# Patient Record
Sex: Male | Born: 1960 | Race: White | Hispanic: No | Smoking: Former smoker
Health system: Southern US, Community
[De-identification: ages and names within clinical notes are randomized; demographics above are authoritative.]

## PROBLEM LIST (undated history)

## (undated) DIAGNOSIS — I10 Essential (primary) hypertension: Secondary | ICD-10-CM

## (undated) HISTORY — DX: Essential (primary) hypertension: I10

## (undated) HISTORY — PX: STERNUM SEPARATION REPAIR: SHX393

## (undated) HISTORY — PX: COLONOSCOPY: SHX174

---

## 1998-04-26 ENCOUNTER — Ambulatory Visit (HOSPITAL_COMMUNITY): Admission: RE | Admit: 1998-04-26 | Discharge: 1998-04-26 | Payer: Self-pay | Admitting: Obstetrics and Gynecology

## 2007-10-15 ENCOUNTER — Other Ambulatory Visit: Payer: Self-pay

## 2007-10-15 ENCOUNTER — Emergency Department: Payer: Self-pay | Admitting: Emergency Medicine

## 2008-08-07 ENCOUNTER — Emergency Department (HOSPITAL_COMMUNITY): Admission: EM | Admit: 2008-08-07 | Discharge: 2008-08-08 | Payer: Self-pay | Admitting: Emergency Medicine

## 2009-04-26 ENCOUNTER — Emergency Department: Payer: Self-pay | Admitting: Emergency Medicine

## 2010-09-10 IMAGING — CT CT CHEST W/ CM
1 of 2 series · 14 of 32 positions shown, 18 images · IV contrast (APPLIED)
Comparison: none

REASON FOR EXAM: dyspnea  h/o international travel
COMMENTS:

[Series 5: lung windows · axial · 0.74mm/px · z∈[-126,+172]mm · 14 of 117 slices shown, 18 images]
[im 9/117  mediastinal]
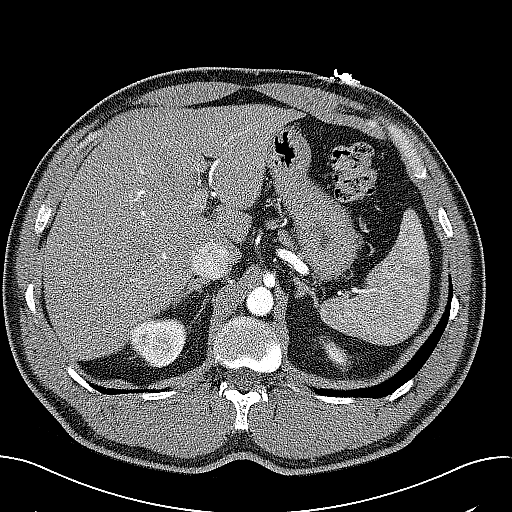
[im 9/117  lung]
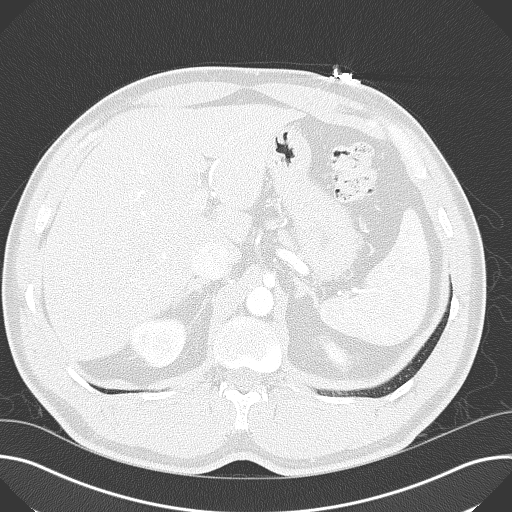
[im 18/117  lung]
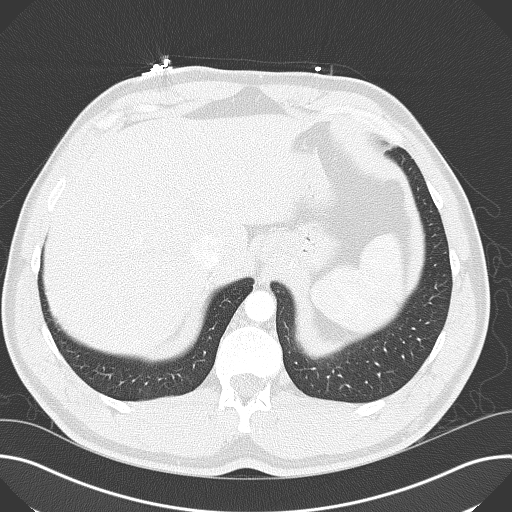
[im 27/117  lung]
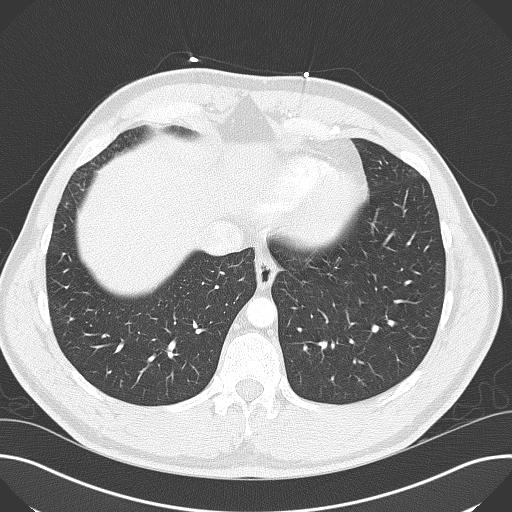
[im 36/117  lung]
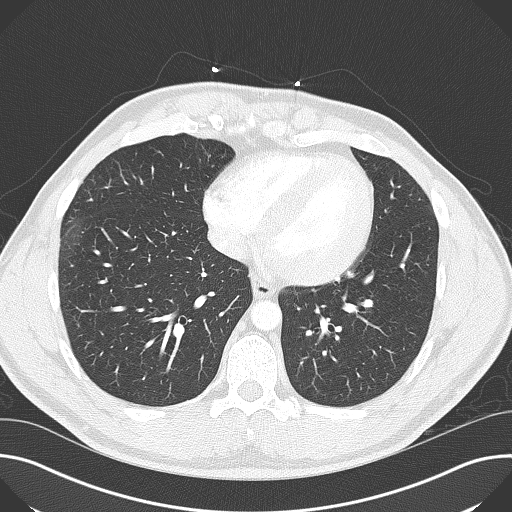
[im 45/117  mediastinal]
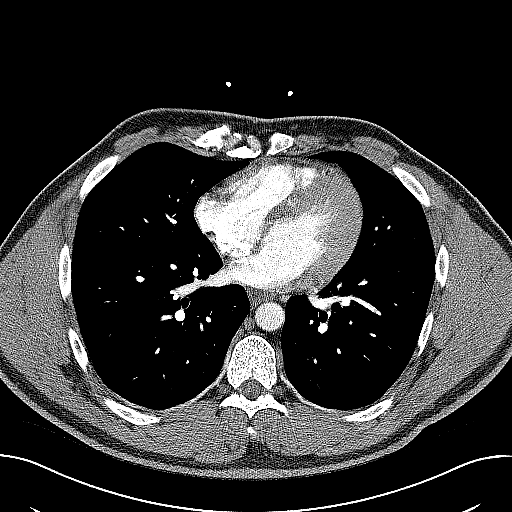
[im 45/117  lung]
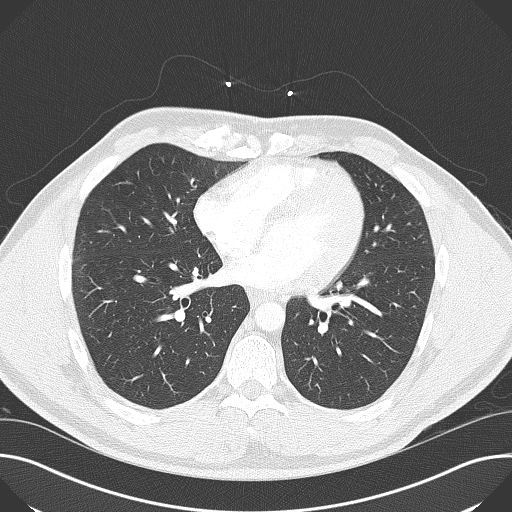
[im 54/117  lung]
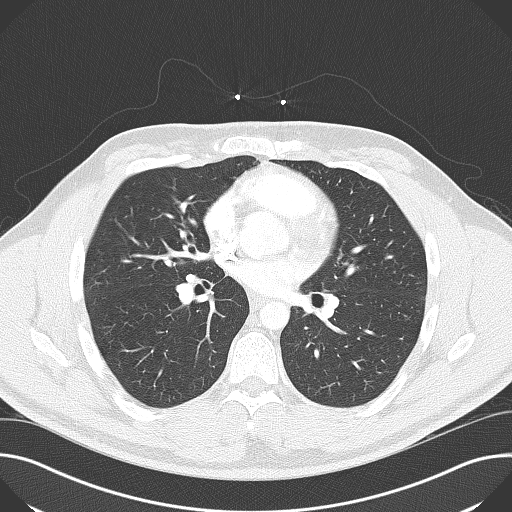
[im 55/117  lung]
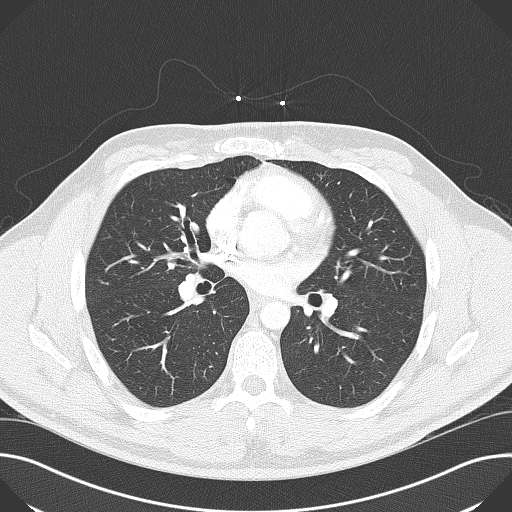
[im 59/117  lung]
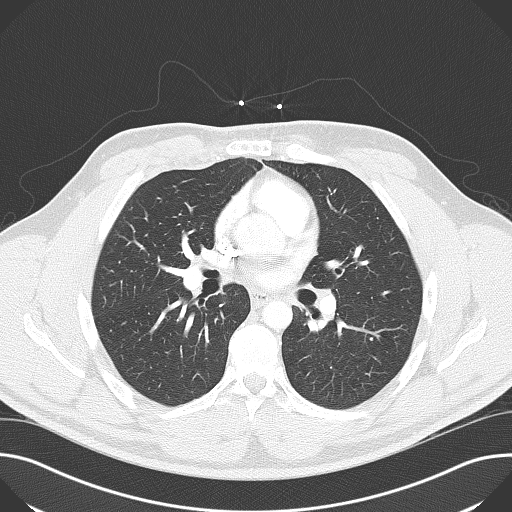
[im 63/117  mediastinal]
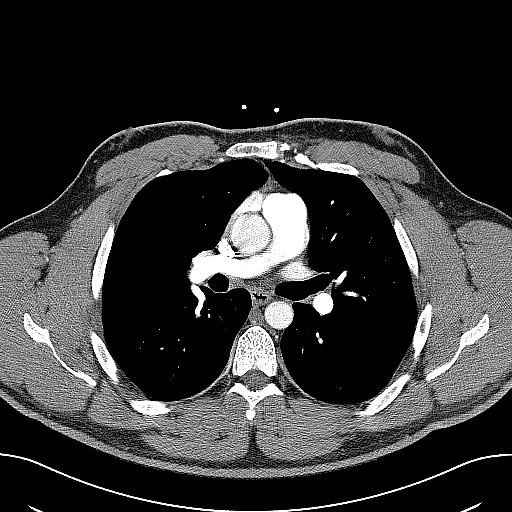
[im 63/117  lung]
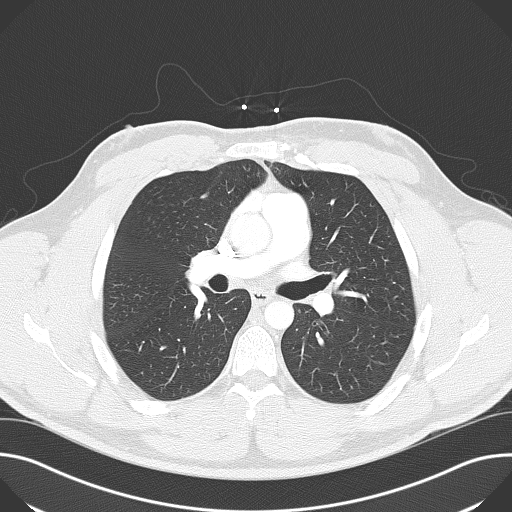
[im 72/117  lung]
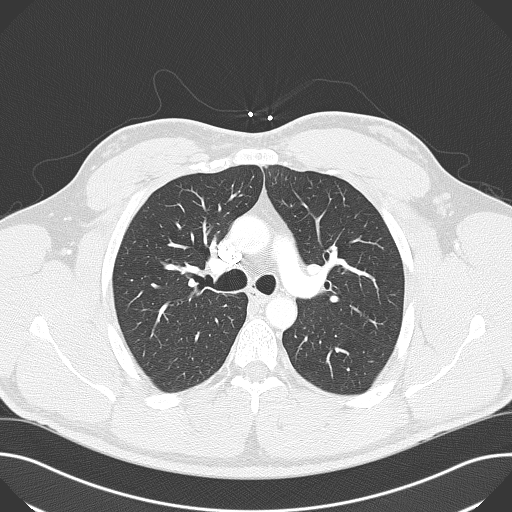
[im 81/117  lung]
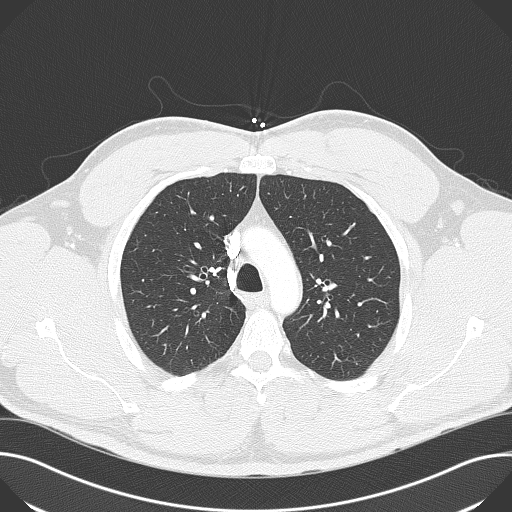
[im 90/117  lung]
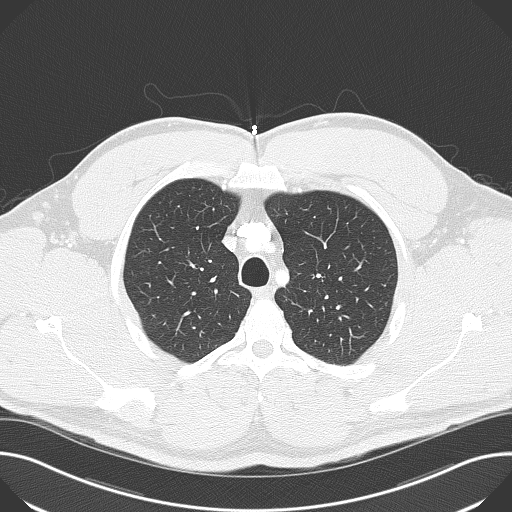
[im 99/117  mediastinal]
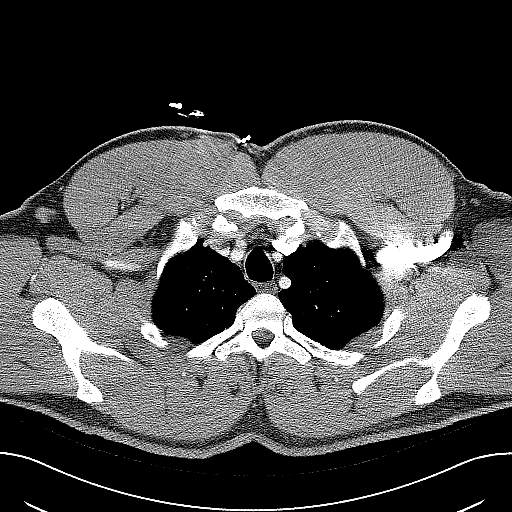
[im 99/117  lung]
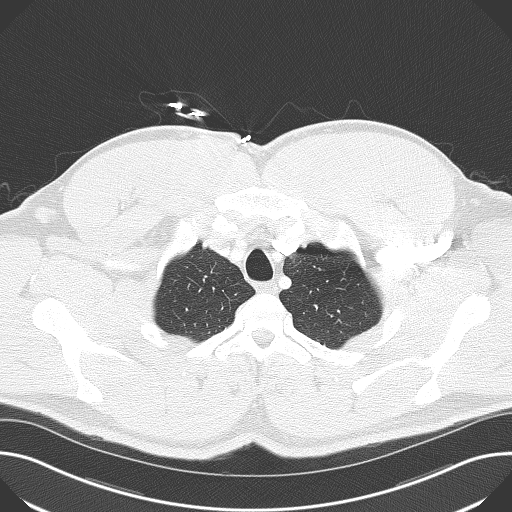
[im 108/117  lung]
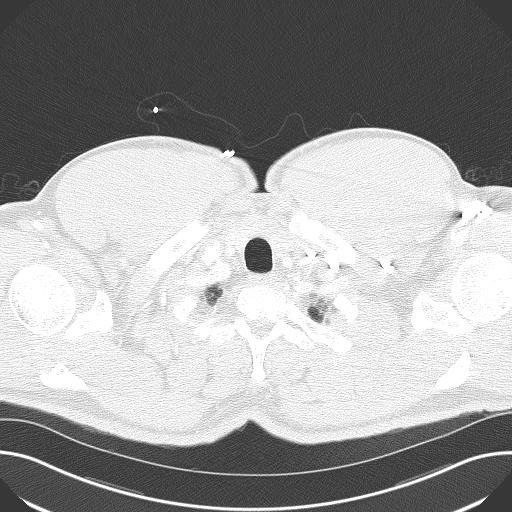

[14 of 32 positions shown; findings below may reference images not displayed]

PROCEDURE:     CT  - CT CHEST (FOR PE) W  - April 26, 2009  [DATE]

RESULT:     Emergent CT of the chest is reconstructed at 3 mm slice
thickness in the axial plane utilizing a pulmonary embolism protocol. There
is no previous exam for comparison. The patient received an injection of 100
mL of Fsovue-CYL iodinated intravenous contrast.

The aorta and pulmonary arteries opacify normally. There is no pleural or
pericardial effusion. The upper abdominal structures appear unremarkable.
There is minimal low-attenuation in the right lobe of the liver on image 112
which is too small for accurate characterization measuring 8 mm. There is no
focal pulmonary nodule. There is no edema, infiltrate, peribronchial
thickening or bronchiectasis.
IMPRESSION: No acute abnormality. No pulmonary embolism. No aortic
dissection or aneurysm. Low-attenuation is present in the right lobe of the
liver which may represent small cyst or hemangioma. This is too small for
accurate characterization.

## 2013-03-06 ENCOUNTER — Ambulatory Visit: Payer: Self-pay | Admitting: Gastroenterology

## 2018-04-18 ENCOUNTER — Ambulatory Visit (HOSPITAL_COMMUNITY): Payer: Self-pay | Admitting: Orthopedic Surgery

## 2018-04-29 ENCOUNTER — Telehealth: Payer: Self-pay

## 2018-04-29 ENCOUNTER — Ambulatory Visit (INDEPENDENT_AMBULATORY_CARE_PROVIDER_SITE_OTHER): Payer: 59 | Admitting: Internal Medicine

## 2018-04-29 ENCOUNTER — Encounter: Payer: Self-pay | Admitting: Internal Medicine

## 2018-04-29 VITALS — BP 185/100 | HR 100 | Temp 98.3°F | Ht 76.0 in | Wt 233.1 lb

## 2018-04-29 DIAGNOSIS — R9431 Abnormal electrocardiogram [ECG] [EKG]: Secondary | ICD-10-CM

## 2018-04-29 DIAGNOSIS — I10 Essential (primary) hypertension: Secondary | ICD-10-CM

## 2018-04-29 DIAGNOSIS — Z23 Encounter for immunization: Secondary | ICD-10-CM

## 2018-04-29 DIAGNOSIS — Z01818 Encounter for other preprocedural examination: Secondary | ICD-10-CM

## 2018-04-29 LAB — CBC WITH DIFFERENTIAL/PLATELET
Basophils Absolute: 0.1 10*3/uL (ref 0.0–0.1)
Basophils Relative: 0.8 % (ref 0.0–3.0)
Eosinophils Absolute: 0.4 10*3/uL (ref 0.0–0.7)
Eosinophils Relative: 4.5 % (ref 0.0–5.0)
HCT: 58.5 % — ABNORMAL HIGH (ref 39.0–52.0)
Hemoglobin: 19.9 g/dL (ref 13.0–17.0)
LYMPHS ABS: 1.3 10*3/uL (ref 0.7–4.0)
Lymphocytes Relative: 16 % (ref 12.0–46.0)
MCHC: 34 g/dL (ref 30.0–36.0)
MCV: 89.5 fl (ref 78.0–100.0)
Monocytes Absolute: 0.6 10*3/uL (ref 0.1–1.0)
Monocytes Relative: 7.8 % (ref 3.0–12.0)
Neutro Abs: 5.7 10*3/uL (ref 1.4–7.7)
Neutrophils Relative %: 70.9 % (ref 43.0–77.0)
Platelets: 217 10*3/uL (ref 150.0–400.0)
RBC: 6.54 Mil/uL — ABNORMAL HIGH (ref 4.22–5.81)
RDW: 13.7 % (ref 11.5–15.5)
WBC: 8 10*3/uL (ref 4.0–10.5)

## 2018-04-29 LAB — LIPID PANEL
Cholesterol: 212 mg/dL — ABNORMAL HIGH (ref 0–200)
HDL: 46.9 mg/dL (ref >39.00)
LDL Cholesterol: 139 mg/dL — ABNORMAL HIGH (ref 0–99)
NonHDL: 165.29
Total CHOL/HDL Ratio: 5
Triglycerides: 129 mg/dL (ref 0.0–149.0)
VLDL: 25.8 mg/dL (ref 0.0–40.0)

## 2018-04-29 LAB — COMPREHENSIVE METABOLIC PANEL
ALT: 38 U/L (ref 0–53)
AST: 26 U/L (ref 0–37)
Albumin: 4.6 g/dL (ref 3.5–5.2)
Alkaline Phosphatase: 54 U/L (ref 39–117)
BUN: 10 mg/dL (ref 6–23)
CHLORIDE: 100 meq/L (ref 96–112)
CO2: 30 mEq/L (ref 19–32)
Calcium: 9.4 mg/dL (ref 8.4–10.5)
Creatinine, Ser: 1.19 mg/dL (ref 0.40–1.50)
GFR: 67.27 mL/min (ref >60.00)
GLUCOSE: 89 mg/dL (ref 70–99)
Potassium: 4.1 mEq/L (ref 3.5–5.1)
Sodium: 140 mEq/L (ref 135–145)
Total Bilirubin: 0.7 mg/dL (ref 0.2–1.2)
Total Protein: 7 g/dL (ref 6.0–8.3)

## 2018-04-29 LAB — TSH: TSH: 2.21 u[IU]/mL (ref 0.35–4.50)

## 2018-04-29 MED ORDER — METOPROLOL SUCCINATE ER 25 MG PO TB24: 25.0000 mg | ORAL_TABLET | Freq: Every day | ORAL | 3 refills | Status: DC

## 2018-04-29 NOTE — Patient Instructions (Signed)
-  It was nice meeting you today!  -Unfortunately with your elevated blood pressure and findings on your EKG that we discussed, I am not able to clear you for surgery today.  In fact I believe you need to see cardiology semi-urgently and have a 2D echocardiogram, this will be arranged today prior to your leaving the office.

## 2018-04-29 NOTE — Telephone Encounter (Signed)
Noted  

## 2018-04-29 NOTE — Addendum Note (Signed)
Addended by: Bonnye Fava on: 04/29/2018 10:53 AM   Modules accepted: Orders

## 2018-04-29 NOTE — Addendum Note (Signed)
Addended by: Kern Reap B on: 04/29/2018 10:59 AM   Modules accepted: Orders

## 2018-04-29 NOTE — Progress Notes (Addendum)
New Patient Office Visit     CC/Reason for Visit: Establish care, medical clearance for surgery Previous PCP: [facility] [place]  HPI: James Phelps is a 57 y.o. male who is coming in today for the above mentioned reasons.  He states he has no past medical history, however has noted that when he gains weight his blood pressure tends to be higher.  He has recently moved from [place] to [place] over the past 2 years after separating from his wife and realizes that this is closer to him which is why he is establishing care in this clinic.  He is scheduled for a C4/C6 fusion by Dr. Shon Baton on December 26.  Told he needed to have a physical for preoperative clearance which is the main reason for his visit today.  He does not take any prescription or over-the-counter medications, his family history is only significant for a father who died of pancreatic cancer in his mid 18s, mother with hyperlipidemia.  He does not have any headache, shortness of breath, lower extremity edema, chest pains, abdominal distention.  Of note he does mention that his daughter who is in medical school checked his blood pressure over the past week while she was on break and noticed that his systolic was in the 160 range.   Past Medical/Surgical History: History reviewed. No pertinent past medical history.  Past Surgical History:  Procedure Laterality Date  . STERNUM SEPARATION REPAIR     In his youth    Social History:  reports that he has never smoked. He has never used smokeless tobacco. He reports that he drinks alcohol. His drug history is not on file.  Allergies: No Known Allergies  Family History:  Family History  Problem Relation Age of Onset  . Hyperlipidemia Mother   . Pancreatic cancer Father      Current Outpatient Medications:  .  metoprolol succinate (TOPROL-XL) 25 MG 24 hr tablet, Take 1 tablet (25 mg total) by mouth daily., Disp: 90 tablet, Rfl: 3  Review of Systems:    Constitutional: Denies fever, chills, diaphoresis, appetite change and fatigue.  HEENT: Denies photophobia, eye pain, redness, hearing loss, ear pain, congestion, sore throat, rhinorrhea, sneezing, mouth sores, trouble swallowing, neck pain, neck stiffness and tinnitus.   Respiratory: Denies SOB, DOE, cough, chest tightness,  and wheezing.   Cardiovascular: Denies chest pain, palpitations and leg swelling.  Gastrointestinal: Denies nausea, vomiting, abdominal pain, diarrhea, constipation, blood in stool and abdominal distention.  Genitourinary: Denies dysuria, urgency, frequency, hematuria, flank pain and difficulty urinating.  Endocrine: Denies: hot or cold intolerance, sweats, changes in hair or nails, polyuria, polydipsia. Musculoskeletal: Denies myalgias, back pain, joint swelling, arthralgias and gait problem.  Skin: Denies pallor, rash and wound.  Neurological: Denies dizziness, seizures, syncope, weakness, light-headedness, numbness and headaches.  Hematological: Denies adenopathy. Easy bruising, personal or family bleeding history  Psychiatric/Behavioral: Denies suicidal ideation, mood changes, confusion, nervousness, sleep disturbance and agitation    Physical Exam: Vitals:   04/29/18 0847  BP: (!) 185/100  Pulse: 100  Temp: 98.3 F (36.8 C)  TempSrc: Oral  SpO2: 97%  Weight: 233 lb 1.6 oz (105.7 kg)  Height: 6\' 4"  (1.93 m)   Body mass index is 28.37 kg/m.  Constitutional: NAD, calm, comfortable Eyes: PERRL, lids and conjunctivae normal ENMT: Mucous membranes are moist. Posterior pharynx clear of any exudate or lesions. Normal dentition.  Neck: normal, supple, no masses, no thyromegaly Respiratory: clear to auscultation bilaterally, no wheezing, no crackles.  Normal respiratory effort. No accessory muscle use.  Cardiovascular: Regular rate and rhythm, no murmurs / rubs / gallops. No extremity edema. 2+ pedal pulses. No carotid bruits.  Abdomen: no tenderness, no masses  palpated. No hepatosplenomegaly. Bowel sounds positive.  Musculoskeletal: no clubbing / cyanosis. No joint deformity upper and lower extremities. Good ROM, no contractures. Normal muscle tone.  Skin: no rashes, lesions, ulcers. No induration Neurologic: CN 2-12 grossly intact. Sensation intact, DTR normal. Strength 5/5 in all 4.  Psychiatric: Normal judgment and insight. Alert and oriented x 3. Normal mood.    Impression and Plan:  Essential hypertension/inferior ischemic changes -BP quite elevated in the range of 180/100, this is been confirmed on recheck. -He has no signs of endorgan damage, no chest pain, headache, dyspnea on exertion, shortness of breath, blurry vision.  We will check blood work today to evaluate kidney function. -EKG today shows T wave inversions and mild ST depression in the inferior leads. -Will refer semi-urgently to cardiology as he will likely need further cardiac testing prior to clearance for surgery. -In the meantime we will request 2D echo urgently. -We will start metoprolol XR 25 mg daily. -Check blood work today including renal function to ensure no endorgan damage.       Pre-operative clearance - Plan: EKG 12-Lead today.  -Please see above for further details. -Full panel of blood work ordered today.  -At this moment patient is not medically clear for surgery.   Addendum: Discussed case with cardiology DOD, Dr. Sherryl Manges, he recommends ordering a [facility], if normal can clear for surgery, if abnormal they will see the same day as a work in.  He is requesting flu vaccination and tetanus booster today.  Will administer.   Patient Instructions  -It was nice meeting you today!  -Unfortunately with your elevated blood pressure and findings on your EKG that we discussed, I am not able to clear you for surgery today.  In fact I believe you need to see cardiology semi-urgently and have a 2D echocardiogram, this will be arranged today prior to  your leaving the office.     Chaya Jan, MD [place] [facility]

## 2018-04-29 NOTE — Telephone Encounter (Signed)
Larita Fife at [facility] called with CRITICAL VALUE  Hgb 19.9 Hct 58.5   Dr. Ardyth Harps - FYI. Thanks!

## 2018-05-02 ENCOUNTER — Ambulatory Visit (HOSPITAL_BASED_OUTPATIENT_CLINIC_OR_DEPARTMENT_OTHER): Payer: 59

## 2018-05-02 ENCOUNTER — Other Ambulatory Visit: Payer: Self-pay

## 2018-05-02 ENCOUNTER — Ambulatory Visit (HOSPITAL_COMMUNITY): Payer: 59 | Attending: Cardiology

## 2018-05-02 VITALS — Ht 76.0 in | Wt 233.0 lb

## 2018-05-02 DIAGNOSIS — R9431 Abnormal electrocardiogram [ECG] [EKG]: Secondary | ICD-10-CM | POA: Diagnosis present

## 2018-05-02 DIAGNOSIS — I10 Essential (primary) hypertension: Secondary | ICD-10-CM | POA: Insufficient documentation

## 2018-05-02 LAB — MYOCARDIAL PERFUSION IMAGING
CHL CUP NUCLEAR SSS: 3
LV dias vol: 138 mL (ref 62–150)
LV sys vol: 76 mL
Peak HR: 114 {beats}/min
Rest HR: 90 {beats}/min
SDS: 2
SRS: 1
TID: 1.04

## 2018-05-02 LAB — ECHOCARDIOGRAM COMPLETE
HEIGHTINCHES: 76 in
Weight: 3728 oz

## 2018-05-02 MED ORDER — TECHNETIUM TC 99M TETROFOSMIN IV KIT
10.1000 | PACK | Freq: Once | INTRAVENOUS | Status: AC | PRN
  Administered 2018-05-02: 10.1 via INTRAVENOUS
  Filled 2018-05-02: qty 11

## 2018-05-02 MED ORDER — REGADENOSON 0.4 MG/5ML IV SOLN
0.4000 mg | Freq: Once | INTRAVENOUS | Status: AC
  Administered 2018-05-02: 0.4 mg via INTRAVENOUS

## 2018-05-02 MED ORDER — TECHNETIUM TC 99M TETROFOSMIN IV KIT
31.5000 | PACK | Freq: Once | INTRAVENOUS | Status: AC | PRN
  Administered 2018-05-02: 31.5 via INTRAVENOUS
  Filled 2018-05-02: qty 32

## 2018-05-03 ENCOUNTER — Telehealth: Payer: Self-pay | Admitting: *Deleted

## 2018-05-03 NOTE — Telephone Encounter (Signed)
Patient is aware of result

## 2018-05-03 NOTE — Telephone Encounter (Signed)
Copied from CRM [id number]. Topic: General - Call Back - No Documentation >> May 03, 2018  9:12 AM Terisa Starr wrote: Reason for CRM: Patient said he missed a call from office about 9:05, thinks it was from Kelly regarding his stress test. Please call patient at your earliest convenience

## 2018-05-05 ENCOUNTER — Ambulatory Visit (INDEPENDENT_AMBULATORY_CARE_PROVIDER_SITE_OTHER): Payer: 59 | Admitting: Cardiology

## 2018-05-05 ENCOUNTER — Encounter: Payer: Self-pay | Admitting: Cardiology

## 2018-05-05 VITALS — BP 174/104 | HR 91 | Ht 76.0 in | Wt 234.4 lb

## 2018-05-05 DIAGNOSIS — R9431 Abnormal electrocardiogram [ECG] [EKG]: Secondary | ICD-10-CM

## 2018-05-05 DIAGNOSIS — E785 Hyperlipidemia, unspecified: Secondary | ICD-10-CM | POA: Diagnosis not present

## 2018-05-05 DIAGNOSIS — I428 Other cardiomyopathies: Secondary | ICD-10-CM | POA: Diagnosis not present

## 2018-05-05 DIAGNOSIS — I10 Essential (primary) hypertension: Secondary | ICD-10-CM | POA: Diagnosis not present

## 2018-05-05 MED ORDER — OLMESARTAN MEDOXOMIL-HCTZ 20-12.5 MG PO TABS: 1.0000 | ORAL_TABLET | Freq: Every day | ORAL | 3 refills | Status: DC

## 2018-05-05 NOTE — Progress Notes (Signed)
Cardiology Office Note   Date:  05/05/2018   ID:  James Phelps, DOB 1961-01-05, MRN [id number]  PCP:  Philip Aspen, Limmie Patricia, MD  Cardiologist:   No primary care provider on file. Referring:  Philip Aspen, Limmie Patricia, MD  Chief Complaint  Patient presents with  . Abnormal ECG      History of Present Illness: James Phelps is a 57 y.o. male who presents for follow-up of an abnormal EKG.  He was being seen preoperative prior to cervical disc surgery.  He was noted to have an EKG with inferior T wave inversion. He had a stress perfusion study that demonstrated an EF of 45%.  There was no reversible ischemia.  His echo was a mildly reduced ejection fraction of 50 to 55%.  He was found to have moderate LVH.  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  Most of that has been weight lifting.  He did use steroids for competition but has not used these in years.  He has had a bit of a hectic life recently with a divorce that just went final.  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  He has been smoking a few cigarettes and drinking too much alcohol.  He eats relatively healthy however.  He is never really had his blood pressure checked but was noted recently during this evaluation to have hypertension.  Because of the abnormalities above he was started on low-dose metoprolol.  He denies with his active exercising lifestyle any cardiovascular symptoms. The patient denies any new symptoms such as chest discomfort, neck or arm discomfort. There has been no new shortness of breath, PND or orthopnea. There have been no reported palpitations, presyncope or syncope.  He has had some arm weakness related to his cervical disc problem.   Past Medical History:  Diagnosis Date  . Hypertension     Past Surgical History:  Procedure Laterality Date  . STERNUM SEPARATION REPAIR     In his youth     Current Outpatient Medications  Medication Sig Dispense Refill  .  olmesartan-hydrochlorothiazide (BENICAR HCT) 20-12.5 MG tablet Take 1 tablet by mouth daily. 90 tablet 3   No current facility-administered medications for this visit.     Allergies:   Patient has no known allergies.    Social History:  The patient  reports that he has quit smoking. His smoking use included cigarettes. He has quit using smokeless tobacco. He reports current alcohol use. He reports that he does not use drugs.   Family History:  The patient's family history includes Arrhythmia in his maternal grandmother; Hyperlipidemia in his father and mother; Hypertension in his mother; Pancreatic cancer in his father.    ROS:  Please see the history of present illness.   Otherwise, review of systems are positive for none.   All other systems are reviewed and negative.    PHYSICAL EXAM: VS:  BP (!) 174/104 Comment: right arm  Pulse 91   Ht 6\' 4"  (1.93 m)   Wt 234 lb 6.4 oz (106.3 kg)   BMI 28.53 kg/m  , BMI Body mass index is 28.53 kg/m. GENERAL:  Well appearing HEENT:  Pupils equal round and reactive, fundi not visualized, oral mucosa unremarkable NECK:  No jugular venous distention, waveform within normal limits, carotid upstroke brisk and symmetric, no bruits, no thyromegaly LYMPHATICS:  No cervical, inguinal adenopathy LUNGS:  Clear to auscultation bilaterally BACK:  No CVA tenderness CHEST: Well-healed scar HEART:  PMI not displaced or sustained,S1 and S2 within normal limits, no S3, no S4, no clicks, no rubs, no murmurs ABD:  Flat, positive bowel sounds normal in frequency in pitch, no bruits, no rebound, no guarding, no midline pulsatile mass, no hepatomegaly, no splenomegaly EXT:  2 plus pulses throughout, no edema, no cyanosis no clubbing SKIN:  No rashes no nodules NEURO:  Cranial nerves II through XII grossly intact, motor grossly intact throughout PSYCH:  Cognitively intact, oriented to person place and time    EKG:  EKG is not ordered today. The ekg ordered  04/29/18 demonstrates sinus rhythm, rate 95, axis within normal limits, intervals within normal limits, inferior T wave inversions.   Recent Labs: 04/29/2018: ALT 38; BUN 10; Creatinine, Ser 1.19; Hemoglobin 19.9 Repeated and verified X2.; Platelets 217.0; Potassium 4.1; Sodium 140; TSH 2.21    Lipid Panel    Component Value Date/Time   CHOL 212 (H) 04/29/2018 1053   TRIG 129.0 04/29/2018 1053   HDL 46.90 04/29/2018 1053   CHOLHDL 5 04/29/2018 1053   VLDL 25.8 04/29/2018 1053   LDLCALC 139 (H) 04/29/2018 1053      Wt Readings from Last 3 Encounters:  05/05/18 234 lb 6.4 oz (106.3 kg)  05/02/18 233 lb (105.7 kg)  04/29/18 233 lb 1.6 oz (105.7 kg)      Other studies Reviewed: Additional studies/ records that were reviewed today include: echo, Lexiscan Myoview.. Review of the above records demonstrates:  Please see elsewhere in the note.     ASSESSMENT AND PLAN:  ABNORMAL EKG:   The patient's EKG is most likely related to hypertensive heart disease.  This will be managed as below.  CARDIOMYOPATHY: He has a mildly reduced ejection fraction but no symptoms.  I am can manage this in the context of treating his blood pressure.  HTN: I am going to start combination therapy with Benicar HCT 20/12.5.  He will likely need up titration.  We talked about therapeutic lifestyle changes to include salt restriction and exercise.  DYSLIPIDEMIA: His LDL was 139 but he has not been particularly watching his diet as much as he could and so he is going to work on this and then have repeated.  HYPERTENSIVE HEART DISEASE:  As above.  PREOP: The patient had no high risk findings on his perfusion study.  He has a very high functional level.  Not having any symptoms.  He is not going for high risk surgery.  Therefore, according to ACC/AHA guidelines the patient is at acceptable risk for the planned procedure.   Current medicines are reviewed at length with the patient today.  The patient does not  have concerns regarding medicines.  The following changes have been made:  As above  Labs/ tests ordered today include:  None No orders of the defined types were placed in this encounter.    Disposition:   FU with me in 3 months.    Signed, Rollene Rotunda, MD  05/05/2018 3:50 PM    [facility]

## 2018-05-05 NOTE — Patient Instructions (Signed)
Medication Instructions:  START- Benicar/Hctz 20/12.5 mg 1 tablet daily STOP- Metoprolol  If you need a refill on your cardiac medications before your next appointment, please call your pharmacy.  Labwork: None Ordered   If you have labs (blood work) drawn today and your tests are completely normal, you will receive your results only by [employer] (if you have MyChart) -OR- A paper copy in the mail.  If you have any lab test that is abnormal or we need to change your treatment, we will call you to review these results.  Testing/Procedures: None Ordered  Follow-Up: . You will need a follow up appointment in 3 months.    At [facility], you and your health needs are our priority.  As part of our continuing mission to provide you with exceptional heart care, we have created designated Provider Care Teams.  These Care Teams include your primary Cardiologist (physician) and Advanced Practice Providers (APPs -  Physician Assistants and Nurse Practitioners) who all work together to provide you with the care you need, when you need it.

## 2018-05-09 NOTE — Pre-Procedure Instructions (Signed)
James Phelps  05/09/2018     No Pharmacies Listed   Your procedure is scheduled on Dec. 26  Report to [facility] Admitting at 5:30 A.M.  Call this number if you have problems the morning of surgery:  [phone]   Remember:  Do not eat or drink after midnight.      Take these medicines the morning of surgery with A SIP OF WATER :   None               7 days prior to surgery STOP taking any Aspirin(unless otherwise instructed by your surgeon), Aleve, Naproxen, Ibuprofen, Motrin, Advil, Goody's, BC's, all herbal medications, fish oil, and all vitamins    Do not wear jewelry.  Do not wear lotions, powders, or perfumes, or deodorant.  Do not shave 48 hours prior to surgery.  Men may shave face and neck.  Do not bring valuables to the hospital.  [facility] is not responsible for any belongings or valuables.  Contacts, dentures or bridgework may not be worn into surgery.  Leave your suitcase in the car.  After surgery it may be brought to your room.  For patients admitted to the hospital, discharge time will be determined by your treatment team.  Patients discharged the day of surgery will not be allowed to drive home.   Special instructions:   [facility]- Preparing For Surgery  Before surgery, you can play an important role. Because skin is not sterile, your skin needs to be as free of germs as possible. You can reduce the number of germs on your skin by washing with CHG (chlorahexidine gluconate) Soap before surgery.  CHG is an antiseptic cleaner which kills germs and bonds with the skin to continue killing germs even after washing.    Oral Hygiene is also important to reduce your risk of infection.  Remember - BRUSH YOUR TEETH THE MORNING OF SURGERY WITH YOUR REGULAR TOOTHPASTE  Please do not use if you have an allergy to CHG or antibacterial soaps. If your skin becomes reddened/irritated stop using the CHG.  Do not shave (including legs and underarms) for at  least 48 hours prior to first CHG shower. It is OK to shave your face.  Please follow these instructions carefully.   1. Shower the NIGHT BEFORE SURGERY and the MORNING OF SURGERY with CHG.   2. If you chose to wash your hair, wash your hair first as usual with your normal shampoo.  3. After you shampoo, rinse your hair and body thoroughly to remove the shampoo.  4. Use CHG as you would any other liquid soap. You can apply CHG directly to the skin and wash gently with a scrungie or a clean washcloth.   5. Apply the CHG Soap to your body ONLY FROM THE NECK DOWN.  Do not use on open wounds or open sores. Avoid contact with your eyes, ears, mouth and genitals (private parts). Wash Face and genitals (private parts)  with your normal soap.  6. Wash thoroughly, paying special attention to the area where your surgery will be performed.  7. Thoroughly rinse your body with warm water from the neck down.  8. DO NOT shower/wash with your normal soap after using and rinsing off the CHG Soap.  9. Pat yourself dry with a CLEAN TOWEL.  10. Wear CLEAN PAJAMAS to bed the night before surgery, wear comfortable clothes the morning of surgery  11. Place CLEAN SHEETS on your bed the  night of your first shower and DO NOT SLEEP WITH PETS.    Day of Surgery:  Do not apply any deodorants/lotions.  Please wear clean clothes to the hospital/surgery center.   Remember to brush your teeth WITH YOUR REGULAR TOOTHPASTE.    Please read over the following fact sheets that you were given. Coughing and Deep Breathing, MRSA Information and Surgical Site Infection Prevention

## 2018-05-10 ENCOUNTER — Encounter (HOSPITAL_COMMUNITY)
Admission: RE | Admit: 2018-05-10 | Discharge: 2018-05-10 | Disposition: A | Discharge status: Home / Self Care | Payer: 59 | Source: Ambulatory Visit | Attending: Orthopedic Surgery | Admitting: Orthopedic Surgery

## 2018-05-10 ENCOUNTER — Ambulatory Visit (HOSPITAL_COMMUNITY): Payer: Self-pay | Admitting: Orthopedic Surgery

## 2018-05-10 ENCOUNTER — Other Ambulatory Visit: Payer: Self-pay

## 2018-05-10 ENCOUNTER — Encounter (HOSPITAL_COMMUNITY): Payer: Self-pay

## 2018-05-10 DIAGNOSIS — Z01812 Encounter for preprocedural laboratory examination: Secondary | ICD-10-CM | POA: Insufficient documentation

## 2018-05-10 LAB — BASIC METABOLIC PANEL
Anion gap: 11 (ref 5–15)
BUN: 12 mg/dL (ref 6–20)
CHLORIDE: 102 mmol/L (ref 98–111)
CO2: 27 mmol/L (ref 22–32)
Calcium: 9.2 mg/dL (ref 8.9–10.3)
Creatinine, Ser: 1.44 mg/dL — ABNORMAL HIGH (ref 0.61–1.24)
GFR calc Af Amer: >60 mL/min (ref >60)
GFR calc non Af Amer: 54 mL/min — ABNORMAL LOW (ref >60)
Glucose, Bld: 95 mg/dL (ref 70–99)
Potassium: 4.3 mmol/L (ref 3.5–5.1)
SODIUM: 140 mmol/L (ref 135–145)

## 2018-05-10 LAB — CBC
HCT: 59.9 % — ABNORMAL HIGH (ref 39.0–52.0)
Hemoglobin: 19.5 g/dL — ABNORMAL HIGH (ref 13.0–17.0)
MCH: 29.1 pg (ref 26.0–34.0)
MCHC: 32.6 g/dL (ref 30.0–36.0)
MCV: 89.4 fL (ref 80.0–100.0)
Platelets: 229 10*3/uL (ref 150–400)
RBC: 6.7 MIL/uL — ABNORMAL HIGH (ref 4.22–5.81)
RDW: 12.8 % (ref 11.5–15.5)
WBC: 7.5 10*3/uL (ref 4.0–10.5)
nRBC: 0 % (ref 0.0–0.2)

## 2018-05-10 LAB — SURGICAL PCR SCREEN
MRSA, PCR: NEGATIVE
STAPHYLOCOCCUS AUREUS: POSITIVE — AB

## 2018-05-10 NOTE — H&P (Deleted)
  The note originally documented on this encounter has been moved the the encounter in which it belongs.  

## 2018-05-10 NOTE — Progress Notes (Addendum)
PCP: Chaya Jan, MD  Cardiologist: Rollene Rotunda, MD  EKG: 04/29/18 in EPIC  Stress test: 05/02/18 in EPIC  ECHO: 05/02/18 in EPIC  Cardiac Cath: pt denies  Chest x-ray: pt denies past year, no recent respiratory infections/complications

## 2018-05-10 NOTE — H&P (Signed)
HPI The patient is here today for a pre-operative History and Physical. They are scheduled for ACDF C4-7 on 05-19-18 with Dr. Shon Baton at [facility]. James Phelps is a very pleasant 57 year old gentleman who has had a 3-4 month history of progressive right upper extremity weakness and intermittent left tricep dysesthesias. As a result of the weakness he presented to me for further evaluation and treatment. There was a period of time he was having significant right arm pain but that has resolved. However the weakness is significantly affecting his overall quality-of-life. Despite physical therapy he continues to deteriorate and has result he presents today for preop assessment for his planned 3 level ACDF C4-7.  Allergies NKDA  Medications desoximetasone 0.25 % topical ointment olmesartan 20 mg-hydrochlorothiazide 12.5 mg tablet           Problems Reviewed Problems Spinal stenosis in cervical region - Onset: 03/25/2018 Pain in cervical spine - Onset: 03/25/2018 Cervical radiculopathy - Onset: 02/09/2018 Pain of right shoulder joint - Onset: 02/07/2018  Social History Reviewed Social History Tobacco Smoking Status: Never smoker Non-smoker Alcohol intake: Occasional  Surgical History Patient indicated no previous surgeries  Past Medical History  Physical Exam Patient is a 57 year old male.  Clinical exam: James Phelps is a pleasant individual, who appears younger than their stated age. He Is alert and orientated 3. No shortness of breath, chest pain. Abdomen is soft and non-tender, negative loss of bowel and bladder control, no rebound tenderness. Negative: skin lesions abrasions contusions  Cardiac: No rubs gallops murmurs. Regular rate and rhythm.  Lungs: Clear to auscultation bilaterally  Peripheral pulses: 2+ upper and lower extremity peripheral pulses. Compartment soft and nontender.  Gait pattern: Normal  Assistive devices: None  Neuro: Negative Romberg's test. Negative nerve  root tension signs in the upper extremity. 3 out of 5 right deltoid, bicep strength. 5 out of 5 wrist extensor, triceps, grip strength. Left upper extremity: 5 out of 5 motor strength throughout. Positive numbness and dysesthesias and decreased sensation to light touch in the right C5, and C6 dermatome. He did note episodes of numbness in the left C7 dermatome (tricep). Negative Babinski test, negative Hoffman test, no clonus. 1+ deep tendon reflexes throughout in the upper extremity except for absent right bicep reflex.  Musculoskeletal: No significant neck pain with palpation or range of motion. No shoulder, elbow, wrist pain with isolated joint range of motion. Negative Tinel sign at the elbow and wrist.  X-rays of the cervical spine: Loss of normal cervical lordosis is noted. Moderate degenerative changes at the C4-5, C5-6, and C6-7 levels noted. No acute fracture is seen.  Cervical MRI completed 03/19/18 was reviewed.: Severe right foraminal stenosis at C4-5 with impingement on the exiting C5 nerve root. Moderate to severe right C5-6 foraminal narrowing, severe left foraminal narrowing with bilateral C6 nerve impingement. Moderate to severe left C6-7 foraminal narrowing with compression of the exiting C7 nerve root. No cord signal changes.   Assessment / Plan Diagnosis: James Phelps is a very pleasant healthy 57 year old gentleman who over the last 3-4 months has noted progressive weakness, atrophy, and fasciculations in the right deltoid and bicep. Initially he thought he had some issue with his shoulder he was seen by my partner Dr. supple and then ultimately referred to me today for further management. It was not felt as though injection therapy would be warranted since he did not have severe pain.  The patient's clinical exam is significant for C5 radiculopathy with significant motor deficits in the deltoid, and  bicep. At this point time based on his MRI findings I do believe the principal source of  his current dysfunction is the C4-5 foraminal stenosis. However he has moderate to severe foraminal stenosis at C5-6 and he has noticed numbness going into the forearm in the C6 dermatome. My concern is if the C4-5 level is addressed surgically that the 5-6 level can become symptomatic which would lead to ongoing bicep weakness and ongoing dysesthesias into the forearm.  I have also talked to him that if we were to address the C4-5 and C5-6 levels that there is a high likelihood that the C6-7 disease would progress and he would have more C7 symptoms. He did recall numbness and dysesthesias in the left bicep which indicates he is beginning to have C7 nerve irritation. Fortunately there is no focal motor deficits.  At this point given the complexity of his findings I have recommended a 3 level ACDF. I have gone over the procedure in great detail with him as well as the risks and benefits of surgery. All of his questions were encouraged and addressed.  Risks and benefits of surgery were discussed with the patient. These include: Infection, bleeding, death, stroke, paralysis, ongoing or worse pain, need for additional surgery, nonunion, leak of spinal fluid, adjacent segment degeneration requiring additional fusion surgery. Pseudoarthrosis (nonunion)requiring supplemental posterior fixation. Throat pain, swallowing difficulties, hoarseness or change in voice.  the patient has obtained preoperative medical clearance from his primary care physician as well as his cardiac clearance. All of his questions were encouraged and addressed. There is been no significant change in his clinical exam other than he has decreased pain in the right arm. His neurological deficit (weakness and dysesthesias) persist. As a result we are moving forward with the planned 3 level ACDF C4-7.

## 2018-05-11 NOTE — Progress Notes (Signed)
Anesthesia Chart Review:  Case:  [id number] Date/Time:  05/19/18 0715   Procedure:  ANTERIOR CERVICAL DECOMPRESSION/DISCECTOMY FUSION C4-7 (N/A ) - 5.5 hrs   Anesthesia type:  General   Pre-op diagnosis:  Right C5-7 radicular weakness with foraminal stenosis   Location:  [facility] OR ROOM 04 / [facility]   Surgeon:  Venita Lick, MD      DISCUSSION: Patient is a 57 year old male scheduled for the above procedure.  History includes former smoker, HTN, sternum separation repair (as youth). Recently seen by cardiologist Dr. Antoine Poche for intermediate risk stress test, non-ischemic, global LV hypokinesis, EF 45%; 50-55% by echo). Treatment of HTN recommended. Preoperative PCP and cardiology input summarized below (see Providers).  If no acute changes then I anticipate that he can proceed as planned.   VS: BP 138/87   Pulse 80   Temp 36.9 C (Oral)   Resp 18   Ht 6\' 4"  (1.93 m)   Wt 103.9 kg   SpO2 99%   BMI 27.87 kg/m     PROVIDERS: Philip Aspen, Limmie Patricia, MD is PCP--newly established 04/29/18. She would not medically clear him for surgery until further cardiac evaluation/testing given EKG findings and BP of 185/100. Metoprolol was started.  Rollene Rotunda, MD is cardiologist--newly established 05/05/18 following abnormal (intermediate risk) stress test (non-ischemic, EF 45%). He thought abnormal EKG was likely related to hypertensive heart disease. Mildly reduced EF (cardiomyopathy) to be treated by BP management. Benicar HCT was added and metoprolol discontinued. In regards to to preoperative evaluation he wrote, "The patient had no high risk findings on his perfusion study.  He has a very high functional level.  Not having any symptoms.  He is not going for high risk surgery.  Therefore, according to ACC/AHA guidelines the patient is at acceptable risk for the planned procedure."   LABS: Labs reviewed: Acceptable for surgery. (all labs ordered are listed, but only abnormal results are  displayed)  Labs Reviewed  SURGICAL PCR SCREEN - Abnormal; Notable for the following components:      Result Value   Staphylococcus aureus POSITIVE (*)    All other components within normal limits  BASIC METABOLIC PANEL - Abnormal; Notable for the following components:   Creatinine, Ser 1.44 (*)    GFR calc non Af Amer 54 (*)    All other components within normal limits  CBC - Abnormal; Notable for the following components:   RBC 6.70 (*)    Hemoglobin 19.5 (*)    HCT 59.9 (*)    All other components within normal limits    IMAGES: N/A   EKG: 04/29/18: SR, T wave abnormality--T wave inversion particularly in inferior leads.    CV: Nuclear stress test 05/02/18:  Nuclear stress EF: 45%.  No T wave inversion was noted during stress.  There was no ST segment deviation noted during stress.  This is an intermediate risk study.  No reversible ischemia. LVEF 45%- mildly dilated LV with global hypokinesis (correlate with echo, as LVEF visually appears higher). This is an intermediate risk study.  Echo 05/02/18: Study Conclusions - Left ventricle: The cavity size was normal. Wall thickness was   increased in a pattern of moderate LVH. Systolic function was   normal. The estimated ejection fraction was in the range of 50%   to 55%. Wall motion was normal; there were no regional wall   motion abnormalities. Doppler parameters are consistent with   abnormal left ventricular relaxation (grade 1 diastolic  dysfunction).   Past Medical History:  Diagnosis Date  . Hypertension     Past Surgical History:  Procedure Laterality Date  . COLONOSCOPY     with sedation-no problems  . STERNUM SEPARATION REPAIR     In his youth    MEDICATIONS: . Acetylcarnitine HCl (ACETYL L-CARNITINE PO)  . Multiple Vitamin (MULTIVITAMIN WITH MINERALS) TABS tablet  . olmesartan-hydrochlorothiazide (BENICAR HCT) 20-12.5 MG tablet   No current facility-administered medications for this encounter.      Velna Ochs [facility] Stay [facility]/Anesthesiology Phone [phone] 05/11/2018 10:59 AM

## 2018-05-11 NOTE — Anesthesia Preprocedure Evaluation (Addendum)
Anesthesia Evaluation  Patient identified by MRN, date of birth, ID band Patient awake    Reviewed: Allergy & Precautions, NPO status , Patient's Chart, lab work & pertinent test results  History of Anesthesia Complications Negative for: history of anesthetic complications  Airway Mallampati: II  TM Distance: >3 FB Neck ROM: Full    Dental  (+) Chipped, Dental Advisory Given   Pulmonary neg pulmonary ROS, former smoker,    breath sounds clear to auscultation       Cardiovascular hypertension, Pt. on medications (-) angina Rhythm:Regular Rate:Normal  05/02/18 ECHO: EF 50-55%, valves OK Dr. Antoine Poche:  intermediate risk stress test, non-ischemic, global LV hypokinesis, EF 45%; 50-55% by echo)   Neuro/Psych    GI/Hepatic negative GI ROS, Neg liver ROS,   Endo/Other  Morbid obesity  Renal/GU Renal InsufficiencyRenal disease (creat 1.44)     Musculoskeletal   Abdominal (+) + obese,   Peds  Hematology negative hematology ROS (+)   Anesthesia Other Findings   Reproductive/Obstetrics                           Anesthesia Physical Anesthesia Plan  ASA: III  Anesthesia Plan: General   Post-op Pain Management:    Induction: Intravenous  PONV Risk Score and Plan: 3 and Ondansetron, Dexamethasone and Scopolamine patch - Pre-op  Airway Management Planned: Oral ETT  Additional Equipment:   Intra-op Plan:   Post-operative Plan: Extubation in OR  Informed Consent: I have reviewed the patients History and Physical, chart, labs and discussed the procedure including the risks, benefits and alternatives for the proposed anesthesia with the patient or authorized representative who has indicated his/her understanding and acceptance.   Dental advisory given  Plan Discussed with: CRNA  Anesthesia Plan Comments: (PAT note written 05/11/2018 by Shonna Chock, PA-C. Plan routine monitors, GETA)       Anesthesia Quick Evaluation

## 2018-05-17 MED ORDER — TRANEXAMIC ACID-NACL 1000-0.7 MG/100ML-% IV SOLN
1000.0000 mg | INTRAVENOUS | Status: AC
  Administered 2018-05-19: 1000 mg via INTRAVENOUS
  Filled 2018-05-17: qty 100

## 2018-05-19 ENCOUNTER — Observation Stay (HOSPITAL_COMMUNITY)
Admission: RE | Admit: 2018-05-19 | Discharge: 2018-05-20 | Disposition: A | Discharge status: Home / Self Care | Payer: 59 | Source: Ambulatory Visit | Attending: Orthopedic Surgery | Admitting: Orthopedic Surgery

## 2018-05-19 ENCOUNTER — Encounter (HOSPITAL_COMMUNITY): Payer: Self-pay | Admitting: *Deleted

## 2018-05-19 ENCOUNTER — Other Ambulatory Visit: Payer: Self-pay

## 2018-05-19 ENCOUNTER — Ambulatory Visit (HOSPITAL_COMMUNITY): Payer: 59 | Admitting: Vascular Surgery

## 2018-05-19 ENCOUNTER — Ambulatory Visit (HOSPITAL_COMMUNITY): Payer: 59

## 2018-05-19 ENCOUNTER — Encounter (HOSPITAL_COMMUNITY)
Admission: RE | Disposition: A | Discharge status: Home / Self Care | Payer: Self-pay | Source: Ambulatory Visit | Attending: Orthopedic Surgery

## 2018-05-19 DIAGNOSIS — I10 Essential (primary) hypertension: Secondary | ICD-10-CM | POA: Insufficient documentation

## 2018-05-19 DIAGNOSIS — M5412 Radiculopathy, cervical region: Principal | ICD-10-CM | POA: Insufficient documentation

## 2018-05-19 DIAGNOSIS — R208 Other disturbances of skin sensation: Secondary | ICD-10-CM | POA: Insufficient documentation

## 2018-05-19 DIAGNOSIS — Z6827 Body mass index (BMI) 27.0-27.9, adult: Secondary | ICD-10-CM | POA: Diagnosis not present

## 2018-05-19 DIAGNOSIS — R531 Weakness: Secondary | ICD-10-CM | POA: Insufficient documentation

## 2018-05-19 DIAGNOSIS — Z419 Encounter for procedure for purposes other than remedying health state, unspecified: Secondary | ICD-10-CM

## 2018-05-19 DIAGNOSIS — M5020 Other cervical disc displacement, unspecified cervical region: Secondary | ICD-10-CM | POA: Diagnosis present

## 2018-05-19 DIAGNOSIS — M2578 Osteophyte, vertebrae: Secondary | ICD-10-CM | POA: Insufficient documentation

## 2018-05-19 HISTORY — PX: ANTERIOR CERVICAL DECOMP/DISCECTOMY FUSION: SHX1161

## 2018-05-19 SURGERY — ANTERIOR CERVICAL DECOMPRESSION/DISCECTOMY FUSION 3 LEVELS
Anesthesia: General | Site: Spine Cervical

## 2018-05-19 MED ORDER — EPHEDRINE 5 MG/ML INJ
INTRAVENOUS | Status: AC
  Filled 2018-05-19: qty 10

## 2018-05-19 MED ORDER — SODIUM CHLORIDE 0.9 % IV SOLN: 250.0000 mL | INTRAVENOUS | Status: DC

## 2018-05-19 MED ORDER — IRBESARTAN 150 MG PO TABS
150.0000 mg | ORAL_TABLET | Freq: Every day | ORAL | Status: DC
  Administered 2018-05-19 – 2018-05-20 (×2): 150 mg via ORAL
  Filled 2018-05-19 (×2): qty 1

## 2018-05-19 MED ORDER — METHOCARBAMOL 500 MG PO TABS: 500.0000 mg | ORAL_TABLET | Freq: Three times a day (TID) | ORAL | 0 refills | Status: AC | PRN

## 2018-05-19 MED ORDER — CEFAZOLIN SODIUM-DEXTROSE 2-4 GM/100ML-% IV SOLN
2.0000 g | INTRAVENOUS | Status: AC
  Administered 2018-05-19 (×2): 2 g via INTRAVENOUS
  Filled 2018-05-19: qty 100

## 2018-05-19 MED ORDER — THROMBIN 20000 UNITS EX SOLR
CUTANEOUS | Status: DC | PRN
  Administered 2018-05-19: 08:00:00

## 2018-05-19 MED ORDER — METHOCARBAMOL 1000 MG/10ML IJ SOLN
500.0000 mg | Freq: Four times a day (QID) | INTRAVENOUS | Status: DC | PRN
  Filled 2018-05-19: qty 5

## 2018-05-19 MED ORDER — DEXAMETHASONE SODIUM PHOSPHATE 10 MG/ML IJ SOLN
INTRAMUSCULAR | Status: AC
  Filled 2018-05-19: qty 1

## 2018-05-19 MED ORDER — FENTANYL CITRATE (PF) 250 MCG/5ML IJ SOLN
INTRAMUSCULAR | Status: AC
  Filled 2018-05-19: qty 5

## 2018-05-19 MED ORDER — ONDANSETRON HCL 4 MG/2ML IJ SOLN
INTRAMUSCULAR | Status: DC | PRN
  Administered 2018-05-19: 4 mg via INTRAVENOUS

## 2018-05-19 MED ORDER — METHOCARBAMOL 500 MG PO TABS
500.0000 mg | ORAL_TABLET | Freq: Four times a day (QID) | ORAL | Status: DC | PRN
  Administered 2018-05-19 – 2018-05-20 (×4): 500 mg via ORAL
  Filled 2018-05-19 (×3): qty 1

## 2018-05-19 MED ORDER — ONDANSETRON HCL 4 MG/2ML IJ SOLN
INTRAMUSCULAR | Status: AC
  Filled 2018-05-19: qty 2

## 2018-05-19 MED ORDER — HYDROMORPHONE HCL 1 MG/ML IJ SOLN
INTRAMUSCULAR | Status: AC
  Filled 2018-05-19: qty 1

## 2018-05-19 MED ORDER — SUGAMMADEX SODIUM 200 MG/2ML IV SOLN
INTRAVENOUS | Status: DC | PRN
  Administered 2018-05-19: 200 mg via INTRAVENOUS

## 2018-05-19 MED ORDER — OXYCODONE-ACETAMINOPHEN 10-325 MG PO TABS: 1.0000 | ORAL_TABLET | Freq: Four times a day (QID) | ORAL | 0 refills | Status: AC | PRN

## 2018-05-19 MED ORDER — ROCURONIUM BROMIDE 50 MG/5ML IV SOSY
PREFILLED_SYRINGE | INTRAVENOUS | Status: AC
  Filled 2018-05-19: qty 5

## 2018-05-19 MED ORDER — OLMESARTAN MEDOXOMIL-HCTZ 20-12.5 MG PO TABS: 1.0000 | ORAL_TABLET | Freq: Every day | ORAL | Status: DC

## 2018-05-19 MED ORDER — METHOCARBAMOL 500 MG PO TABS
ORAL_TABLET | ORAL | Status: AC
  Filled 2018-05-19: qty 1

## 2018-05-19 MED ORDER — PROPOFOL 10 MG/ML IV BOLUS
INTRAVENOUS | Status: AC
  Filled 2018-05-19: qty 40

## 2018-05-19 MED ORDER — SODIUM CHLORIDE 0.9 % IV SOLN
INTRAVENOUS | Status: DC | PRN
  Administered 2018-05-19: 12:00:00 via INTRAVENOUS
  Administered 2018-05-19: 50 ug/min via INTRAVENOUS

## 2018-05-19 MED ORDER — FENTANYL CITRATE (PF) 250 MCG/5ML IJ SOLN
INTRAMUSCULAR | Status: DC | PRN
  Administered 2018-05-19 (×4): 50 ug via INTRAVENOUS
  Administered 2018-05-19: 100 ug via INTRAVENOUS
  Administered 2018-05-19 (×2): 50 ug via INTRAVENOUS
  Administered 2018-05-19: 150 ug via INTRAVENOUS
  Administered 2018-05-19: 50 ug via INTRAVENOUS

## 2018-05-19 MED ORDER — LIDOCAINE 2% (20 MG/ML) 5 ML SYRINGE
INTRAMUSCULAR | Status: AC
  Filled 2018-05-19: qty 5

## 2018-05-19 MED ORDER — DEXAMETHASONE SODIUM PHOSPHATE 10 MG/ML IJ SOLN
INTRAMUSCULAR | Status: DC | PRN
  Administered 2018-05-19: 8 mg via INTRAVENOUS

## 2018-05-19 MED ORDER — LACTATED RINGERS IV SOLN
INTRAVENOUS | Status: DC | PRN
  Administered 2018-05-19 (×2): via INTRAVENOUS

## 2018-05-19 MED ORDER — LACTATED RINGERS IV SOLN
INTRAVENOUS | Status: DC | PRN
  Administered 2018-05-19 (×2): via INTRAVENOUS

## 2018-05-19 MED ORDER — MIDAZOLAM HCL 2 MG/2ML IJ SOLN: 0.5000 mg | Freq: Once | INTRAMUSCULAR | Status: DC | PRN

## 2018-05-19 MED ORDER — HYDROCHLOROTHIAZIDE 12.5 MG PO CAPS
12.5000 mg | ORAL_CAPSULE | Freq: Every day | ORAL | Status: DC
  Administered 2018-05-19 – 2018-05-20 (×2): 12.5 mg via ORAL
  Filled 2018-05-19 (×2): qty 1

## 2018-05-19 MED ORDER — PROPOFOL 10 MG/ML IV BOLUS
INTRAVENOUS | Status: DC | PRN
  Administered 2018-05-19: 200 mg via INTRAVENOUS

## 2018-05-19 MED ORDER — ROCURONIUM BROMIDE 100 MG/10ML IV SOLN
INTRAVENOUS | Status: DC | PRN
  Administered 2018-05-19: 20 mg via INTRAVENOUS
  Administered 2018-05-19: 10 mg via INTRAVENOUS
  Administered 2018-05-19: 20 mg via INTRAVENOUS
  Administered 2018-05-19: 30 mg via INTRAVENOUS
  Administered 2018-05-19: 20 mg via INTRAVENOUS
  Administered 2018-05-19: 50 mg via INTRAVENOUS
  Administered 2018-05-19: 20 mg via INTRAVENOUS
  Administered 2018-05-19: 10 mg via INTRAVENOUS
  Administered 2018-05-19: 20 mg via INTRAVENOUS

## 2018-05-19 MED ORDER — MENTHOL 3 MG MT LOZG
1.0000 | LOZENGE | OROMUCOSAL | Status: DC | PRN
  Filled 2018-05-19: qty 9

## 2018-05-19 MED ORDER — MEPERIDINE HCL 50 MG/ML IJ SOLN: 6.2500 mg | INTRAMUSCULAR | Status: DC | PRN

## 2018-05-19 MED ORDER — OXYCODONE HCL 5 MG PO TABS
10.0000 mg | ORAL_TABLET | ORAL | Status: DC | PRN
  Administered 2018-05-19 – 2018-05-20 (×4): 10 mg via ORAL
  Filled 2018-05-19 (×4): qty 2

## 2018-05-19 MED ORDER — SCOPOLAMINE 1 MG/3DAYS TD PT72
1.0000 | MEDICATED_PATCH | Freq: Once | TRANSDERMAL | Status: DC
  Administered 2018-05-19: 1.5 mg via TRANSDERMAL
  Filled 2018-05-19: qty 1

## 2018-05-19 MED ORDER — ACETAMINOPHEN 325 MG PO TABS
650.0000 mg | ORAL_TABLET | ORAL | Status: DC | PRN
  Administered 2018-05-20: 650 mg via ORAL
  Filled 2018-05-19: qty 2

## 2018-05-19 MED ORDER — ROCURONIUM BROMIDE 50 MG/5ML IV SOSY
PREFILLED_SYRINGE | INTRAVENOUS | Status: AC
  Filled 2018-05-19: qty 10

## 2018-05-19 MED ORDER — THROMBIN 20000 UNITS EX SOLR
CUTANEOUS | Status: AC
  Filled 2018-05-19: qty 20000

## 2018-05-19 MED ORDER — SODIUM CHLORIDE 0.9% FLUSH: 3.0000 mL | Freq: Two times a day (BID) | INTRAVENOUS | Status: DC

## 2018-05-19 MED ORDER — PHENOL 1.4 % MT LIQD: 1.0000 | OROMUCOSAL | Status: DC | PRN

## 2018-05-19 MED ORDER — SODIUM CHLORIDE 0.9% FLUSH: 3.0000 mL | INTRAVENOUS | Status: DC | PRN

## 2018-05-19 MED ORDER — ONDANSETRON HCL 4 MG PO TABS: 4.0000 mg | ORAL_TABLET | Freq: Three times a day (TID) | ORAL | 0 refills | Status: AC | PRN

## 2018-05-19 MED ORDER — THROMBIN 5000 UNITS EX SOLR
OROMUCOSAL | Status: DC | PRN
  Administered 2018-05-19: 11:00:00

## 2018-05-19 MED ORDER — MIDAZOLAM HCL 2 MG/2ML IJ SOLN
INTRAMUSCULAR | Status: AC
  Filled 2018-05-19: qty 2

## 2018-05-19 MED ORDER — BUPIVACAINE-EPINEPHRINE 0.25% -1:200000 IJ SOLN
INTRAMUSCULAR | Status: DC | PRN
  Administered 2018-05-19: 9 mL

## 2018-05-19 MED ORDER — ACETAMINOPHEN 650 MG RE SUPP: 650.0000 mg | RECTAL | Status: DC | PRN

## 2018-05-19 MED ORDER — LABETALOL HCL 5 MG/ML IV SOLN
INTRAVENOUS | Status: AC
  Filled 2018-05-19: qty 4

## 2018-05-19 MED ORDER — OXYCODONE HCL 5 MG PO TABS
5.0000 mg | ORAL_TABLET | ORAL | Status: DC | PRN
  Administered 2018-05-20 (×2): 5 mg via ORAL
  Filled 2018-05-19 (×2): qty 1

## 2018-05-19 MED ORDER — PHENYLEPHRINE 40 MCG/ML (10ML) SYRINGE FOR IV PUSH (FOR BLOOD PRESSURE SUPPORT)
PREFILLED_SYRINGE | INTRAVENOUS | Status: DC | PRN
  Administered 2018-05-19: 120 ug via INTRAVENOUS
  Administered 2018-05-19: 40 ug via INTRAVENOUS
  Administered 2018-05-19 (×3): 80 ug via INTRAVENOUS

## 2018-05-19 MED ORDER — HYDROMORPHONE HCL 1 MG/ML IJ SOLN
0.2500 mg | INTRAMUSCULAR | Status: DC | PRN
  Administered 2018-05-19: 0.5 mg via INTRAVENOUS

## 2018-05-19 MED ORDER — CEFAZOLIN SODIUM-DEXTROSE 1-4 GM/50ML-% IV SOLN
1.0000 g | Freq: Three times a day (TID) | INTRAVENOUS | Status: AC
  Administered 2018-05-19 – 2018-05-20 (×2): 1 g via INTRAVENOUS
  Filled 2018-05-19 (×2): qty 50

## 2018-05-19 MED ORDER — ACETAMINOPHEN 10 MG/ML IV SOLN
INTRAVENOUS | Status: DC | PRN
  Administered 2018-05-19: 1000 mg via INTRAVENOUS

## 2018-05-19 MED ORDER — ONDANSETRON HCL 4 MG PO TABS: 4.0000 mg | ORAL_TABLET | Freq: Four times a day (QID) | ORAL | Status: DC | PRN

## 2018-05-19 MED ORDER — LACTATED RINGERS IV SOLN: INTRAVENOUS | Status: DC

## 2018-05-19 MED ORDER — ONDANSETRON HCL 4 MG/2ML IJ SOLN: 4.0000 mg | Freq: Four times a day (QID) | INTRAMUSCULAR | Status: DC | PRN

## 2018-05-19 MED ORDER — ACETAMINOPHEN 10 MG/ML IV SOLN
INTRAVENOUS | Status: AC
  Filled 2018-05-19: qty 100

## 2018-05-19 MED ORDER — CEFAZOLIN SODIUM 1 G IJ SOLR
INTRAMUSCULAR | Status: AC
  Filled 2018-05-19: qty 20

## 2018-05-19 MED ORDER — BUPIVACAINE-EPINEPHRINE (PF) 0.25% -1:200000 IJ SOLN
INTRAMUSCULAR | Status: AC
  Filled 2018-05-19: qty 30

## 2018-05-19 MED ORDER — THROMBIN 5000 UNITS EX SOLR
CUTANEOUS | Status: AC
  Filled 2018-05-19: qty 5000

## 2018-05-19 MED ORDER — EPHEDRINE SULFATE-NACL 50-0.9 MG/10ML-% IV SOSY
PREFILLED_SYRINGE | INTRAVENOUS | Status: DC | PRN
  Administered 2018-05-19: 5 mg via INTRAVENOUS
  Administered 2018-05-19: 10 mg via INTRAVENOUS
  Administered 2018-05-19 (×2): 5 mg via INTRAVENOUS

## 2018-05-19 MED ORDER — LIDOCAINE HCL (CARDIAC) PF 100 MG/5ML IV SOSY
PREFILLED_SYRINGE | INTRAVENOUS | Status: DC | PRN
  Administered 2018-05-19: 20 mg via INTRAVENOUS

## 2018-05-19 MED ORDER — 0.9 % SODIUM CHLORIDE (POUR BTL) OPTIME
TOPICAL | Status: DC | PRN
  Administered 2018-05-19 (×2): 1000 mL

## 2018-05-19 MED ORDER — PROMETHAZINE HCL 25 MG/ML IJ SOLN: 6.2500 mg | INTRAMUSCULAR | Status: DC | PRN

## 2018-05-19 MED ORDER — MIDAZOLAM HCL 2 MG/2ML IJ SOLN
INTRAMUSCULAR | Status: DC | PRN
  Administered 2018-05-19: 2 mg via INTRAVENOUS

## 2018-05-19 MED ORDER — LABETALOL HCL 5 MG/ML IV SOLN
5.0000 mg | INTRAVENOUS | Status: DC | PRN
  Administered 2018-05-19: 5 mg via INTRAVENOUS

## 2018-05-19 MED ORDER — PHENYLEPHRINE 40 MCG/ML (10ML) SYRINGE FOR IV PUSH (FOR BLOOD PRESSURE SUPPORT)
PREFILLED_SYRINGE | INTRAVENOUS | Status: AC
  Filled 2018-05-19: qty 10

## 2018-05-19 SURGICAL SUPPLY — 81 items
BLADE CLIPPER SURG (BLADE) IMPLANT
BONE VIVIGEN FORMABLE 5.4CC (Bone Implant) ×2 IMPLANT
BUR EGG ELITE 4.0 (BURR) IMPLANT
BUR MATCHSTICK NEURO 3.0 LAGG (BURR) IMPLANT
CABLE BIPOLOR RESECTION CORD (MISCELLANEOUS) ×2 IMPLANT
CAGE SPNL 6D 14XMED 16X7X (Cage) IMPLANT
CANISTER SUCT 3000ML PPV (MISCELLANEOUS) ×2 IMPLANT
CLSR STERI-STRIP ANTIMIC 1/2X4 (GAUZE/BANDAGES/DRESSINGS) ×2 IMPLANT
COVER MAYO STAND STRL (DRAPES) ×6 IMPLANT
COVER SURGICAL LIGHT HANDLE (MISCELLANEOUS) ×2 IMPLANT
COVER WAND RF STERILE (DRAPES) ×1 IMPLANT
CRADLE DONUT ADULT HEAD (MISCELLANEOUS) ×2 IMPLANT
DEVICE FUSION NANLCK MED 6 6D (Cage) IMPLANT
DRAIN TLS ROUND 10FR (DRAIN) ×1 IMPLANT
DRAPE C-ARM 42X72 X-RAY (DRAPES) ×2 IMPLANT
DRAPE MICROSCOPE LEICA 46X105 (MISCELLANEOUS) IMPLANT
DRAPE POUCH INSTRU U-SHP 10X18 (DRAPES) ×2 IMPLANT
DRAPE SURG 17X23 STRL (DRAPES) ×2 IMPLANT
DRAPE U-SHAPE 47X51 STRL (DRAPES) ×2 IMPLANT
DRSG OPSITE POSTOP 4X6 (GAUZE/BANDAGES/DRESSINGS) ×2 IMPLANT
DURAPREP 26ML APPLICATOR (WOUND CARE) ×2 IMPLANT
ELECT COATED BLADE 2.86 ST (ELECTRODE) ×2 IMPLANT
ELECT PENCIL ROCKER SW 15FT (MISCELLANEOUS) ×2 IMPLANT
ELECT REM PT RETURN 9FT ADLT (ELECTROSURGICAL) ×2
ELECTRODE REM PT RTRN 9FT ADLT (ELECTROSURGICAL) ×1 IMPLANT
FLOSEAL 10ML (HEMOSTASIS) IMPLANT
FUSION NANOLOCK MED 6 6D (Cage) ×2 IMPLANT
FUSION TCS NANOLOCK 7MM 6DEG (Cage) ×4 IMPLANT
GAUZE SPONGE 2X2 8PLY STRL LF (GAUZE/BANDAGES/DRESSINGS) IMPLANT
GLOVE BIO SURGEON STRL SZ 6.5 (GLOVE) ×2 IMPLANT
GLOVE BIOGEL PI IND STRL 6.5 (GLOVE) ×1 IMPLANT
GLOVE BIOGEL PI IND STRL 7.0 (GLOVE) IMPLANT
GLOVE BIOGEL PI IND STRL 8.5 (GLOVE) ×1 IMPLANT
GLOVE BIOGEL PI INDICATOR 6.5 (GLOVE) ×1
GLOVE BIOGEL PI INDICATOR 7.0 (GLOVE) ×1
GLOVE BIOGEL PI INDICATOR 8.5 (GLOVE) ×3
GLOVE ECLIPSE 8.0 STRL XLNG CF (GLOVE) ×4 IMPLANT
GLOVE SS BIOGEL STRL SZ 8.5 (GLOVE) ×1 IMPLANT
GLOVE SUPERSENSE BIOGEL SZ 8.5 (GLOVE) ×1
GOWN STRL REUS W/ TWL LRG LVL3 (GOWN DISPOSABLE) ×2 IMPLANT
GOWN STRL REUS W/TWL 2XL LVL3 (GOWN DISPOSABLE) ×5 IMPLANT
GOWN STRL REUS W/TWL LRG LVL3 (GOWN DISPOSABLE)
GRAFT BNE MATRIX VG FRMBL MD 5 (Bone Implant) IMPLANT
HEMOSTAT POWDER SURGIFOAM 1G (HEMOSTASIS) ×1 IMPLANT
KIT BASIN OR (CUSTOM PROCEDURE TRAY) ×2 IMPLANT
KIT TURNOVER KIT B (KITS) ×2 IMPLANT
NDL SPNL 18GX3.5 QUINCKE PK (NEEDLE) ×1 IMPLANT
NEEDLE HYPO 22GX1.5 SAFETY (NEEDLE) ×2 IMPLANT
NEEDLE SPNL 18GX3.5 QUINCKE PK (NEEDLE) ×2 IMPLANT
NS IRRIG 1000ML POUR BTL (IV SOLUTION) ×3 IMPLANT
PACK ORTHO CERVICAL (CUSTOM PROCEDURE TRAY) ×2 IMPLANT
PACK UNIVERSAL I (CUSTOM PROCEDURE TRAY) ×2 IMPLANT
PAD ARMBOARD 7.5X6 YLW CONV (MISCELLANEOUS) ×6 IMPLANT
PATTIES SURGICAL .25X.25 (GAUZE/BANDAGES/DRESSINGS) ×2 IMPLANT
PATTIES SURGICAL .5 X.5 (GAUZE/BANDAGES/DRESSINGS) IMPLANT
PIN DISTRACTION 14 (PIN) ×2 IMPLANT
RESTRAINT LIMB HOLDER UNIV (RESTRAINTS) ×3 IMPLANT
RUBBERBAND STERILE (MISCELLANEOUS) IMPLANT
SCREW 3.8X16MM (Screw) ×1 IMPLANT
SCREW ENDO BONE 3.8X14MM (Screw) ×1 IMPLANT
SCREW LOCKING 14MMX3.5MM (Screw) ×4 IMPLANT
SPONGE GAUZE 2X2 STER 10/PKG (GAUZE/BANDAGES/DRESSINGS) ×1
SPONGE INTESTINAL PEANUT (DISPOSABLE) ×4 IMPLANT
SPONGE LAP 4X18 RFD (DISPOSABLE) ×4 IMPLANT
SPONGE SURGIFOAM ABS GEL 100 (HEMOSTASIS) ×2 IMPLANT
SUT BONE WAX W31G (SUTURE) ×2 IMPLANT
SUT MON AB 3-0 SH 27 (SUTURE) ×2
SUT MON AB 3-0 SH27 (SUTURE) ×1 IMPLANT
SUT SILK 2 0 (SUTURE) ×2
SUT SILK 2-0 18XBRD TIE 12 (SUTURE) ×1 IMPLANT
SUT VIC AB 2-0 CT1 18 (SUTURE) ×2 IMPLANT
SYR BULB IRRIGATION 50ML (SYRINGE) ×2 IMPLANT
SYR CONTROL 10ML LL (SYRINGE) ×2 IMPLANT
TAPE CLOTH 4X10 WHT NS (GAUZE/BANDAGES/DRESSINGS) ×2 IMPLANT
TAPE CLOTH SURG 4X10 WHT LF (GAUZE/BANDAGES/DRESSINGS) ×1 IMPLANT
TAPE UMBILICAL COTTON 1/8X30 (MISCELLANEOUS) ×2 IMPLANT
TOWEL GREEN STERILE (TOWEL DISPOSABLE) ×2 IMPLANT
TOWEL GREEN STERILE FF (TOWEL DISPOSABLE) ×2 IMPLANT
TRAY FOLEY MTR SLVR 16FR STAT (SET/KITS/TRAYS/PACK) ×2 IMPLANT
WATER STERILE IRR 1000ML POUR (IV SOLUTION) ×2 IMPLANT
YANKAUER SUCT BULB TIP NO VENT (SUCTIONS) ×1 IMPLANT

## 2018-05-19 NOTE — Op Note (Signed)
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  Postoperative diagnosis: Same  Operative procedure: ACDF C4-7  Complications: None  Condition: Stable  Anesthesia: General  EBL: 50 cc  Implants: Titan Nano lock 0 profile intervertebral cages.  C4-5: 6 mm medium lordotic cage affixed with 16 mm by 3.8 diameter screw into see 4 and 14 mm x 3.5 screw into C5.  C5-6 and C6-7: 7 mm lordotic medium 0 profile cages.  Affixed with 14 by 3.5 diameter screws, except for the screw going into C7 which was 14 mm x 3.8.  Allograft:vivogen  Indications: James Phelps is a very pleasant 57 year old gentleman who presented to me with progressive upper extremity weakness.  He did not have significant pain but his overall quality of life had begun to diminish.  Patient was noted to have focal motor deficits along with fasciculations primarily in the right deltoid and bicep.  Imaging studies demonstrated foraminal stenosis with nerve compression at C4-5, and C5-6.  He also had a left-sided hard disc osteophyte causing foraminal compromise.  Because of the imaging studies as well as the intermittent dysesthesias he was having into the left side C7 dermatome we elected to address all 3 levels.  All appropriate risks benefits and alternatives to surgery were discussed with the patient and consent was obtained.  Operative report  Patient was brought the operating room placed upon the operating table.  After successful induction of general anesthesia and endotracheal patient teds SCDs and a Foley were inserted.  The anterior cervical spine was then prepped and draped per standard approach to the neck.  A timeout was taken to confirm patient procedure and all other important data.  Left-sided standard Smith-Robinson approach to the cervical spine was undertaken.  A longitudinal incision was made of the medial border of the  sternocleidomastoid and sharp dissection was carried out down to the platysma.  The platysma was sharply incised and I continued sharply dissecting along the medial border the sternocleidomastoid into the deep cervical fascia.  The omohyoid muscle was identified isolated and sacrificed for improved visualization.  Dissecting through the prevertebral fascia was able to visualize and palpate the anterior longitudinal ligament.  I swept the esophagus and trachea over to the right and protected with an appendiceal retractor and identified the carotid sheath and protected that with my finger.  A needle was placed into the C4-5 disc space and an x-ray was taken to confirm I was at the appropriate level.  Using bipolar cautery I mobilized the longus coli muscle from the superior aspect of C4 to the inferior aspect of C7.  I exposed out to the uncovertebral joint.  Self-retaining retractors were then placed underneath the longus coli muscle and the endotracheal cuff was deflated and expanded the retractor to the appropriate width.  I could now visualize the C4-5 level.  An annulotomy was performed with a 15 blade scalpel, and using pituitary rongeurs curettes and Kerrison rongeurs I removed all of the disc material as well as the overhanging osteophyte from the inferior aspect of C4.  Distraction pins were placed into the body of C4 and C5 and I distracted the intervertebral space with a lamina spreader and maintain that with the distraction pin set.  Using my neuro curettes I worked posteriorly removing the remainder of disc and annulus.  I then used my fine nerve hook to gently dissect through the posterior longitudinal ligament until I developed a plane between  the PLL and the thecal sac.  This allowed me to use my 1 mm Kerrison to resect the PLL.  With this down I was able to get under the uncovertebral joint and remove the osteophyte causing the neural compression.  At this point I had excellent decompression at the  C4-5 level.  I then used my rasp trials and elected to put the size 6 medium lordotic cage in place.  This was packed with the allograft and malleted to place.  Using the awl I broach the C4 and C5 cortices and then placed the locking screws.  A 16 x 3.8 mm screw was used into C4 and a 14 x 3.5 screw was used into C5.  Both screws had excellent purchase, and the overall construct was well-seated.  At this point I repositioned my retractor to expose the C5-6 disc space and using the exact same technique I remove the disc material at this level.  I again took down the PLL in a similar fashion and made sure I had excellent decompression especially on the right side as this was his symptomatic side.  Again once the decompression and discectomy were complete and I was able to get under the uncovertebral joint and resect the overhanging osteophyte I then trialed the intervertebral space.  I made sure the endplates were rasped and then I placed the size 7 medium lordotic cage.  This was affixed with 14 x 3.5 screws.  Both screws again got excellent purchase.  I then repositioned the retractor at the final C6-7 level and again performed a discectomy similar to what I had put done at the other 2 levels.  After removing the disc material I again developed a plane under posterior longitudinal ligament and resected it.  This allowed me to get under the uncovertebral joint especially on the left-hand side to ensure that the disc fragment was removed and the C7 nerve root was no longer being irritated.  I then placed a 7 mm medium lordotic cage and affixed it with a 3.8 x 14 screw into C7 and a 3.5 x 14 screw into the C6.  Again all screws had excellent purchase.  At this point the distraction pins were removed and I irrigated the wound copiously with normal saline.  X-rays were taken which demonstrated satisfactory overall position of all 3 vertebral constructs.  At this point I irrigated the wound copiously normal saline  and obtained hemostasis using bipolar electrocautery and FloSeal.  I did place a drain out of a separate stab incision and then reapproximated the platysma with interrupted 2-0 Vicryl suture.  Skin was closed with 3-0 Monocryl.  Steri-Strips dry dressings were applied and the patient was ultimately extubated transfer the PACU without incident.  The end of the case all needle and sponge counts were correct.  There were no adverse intraoperative events.

## 2018-05-19 NOTE — Discharge Instructions (Signed)

## 2018-05-19 NOTE — Transfer of Care (Signed)
Immediate Anesthesia Transfer of Care Note  Patient: James Phelps  Procedure(s) Performed: ANTERIOR CERVICAL DECOMPRESSION/DISCECTOMY FUSION CERVICAL FOUR-CERVICAL SEVEN (N/A Spine Cervical)  Patient Location: PACU  Anesthesia Type:General  Level of Consciousness: drowsy  Airway & Oxygen Therapy: Patient Spontanous Breathing and Patient connected to face mask oxygen  Post-op Assessment: Report given to RN and Post -op Vital signs reviewed and stable  Post vital signs: Reviewed and stable  Last Vitals:  Vitals Value Taken Time  BP 152/68 05/19/2018  1:12 PM  Temp    Pulse 108 05/19/2018  1:17 PM  Resp 16 05/19/2018  1:17 PM  SpO2 98 % 05/19/2018  1:17 PM  Vitals shown include unvalidated device data.  Last Pain:  Vitals:   05/19/18 1315  TempSrc:   PainSc: (P) Asleep      Patients Stated Pain Goal: 3 (05/19/18 0559)  Complications: No apparent anesthesia complications

## 2018-05-19 NOTE — H&P (Signed)
Addendum:  No change in clinical exam Patient has ongoing right C5 and C6 weakness as well as left C7 dysesthesias.  Imaging study shows C4-7 pathology.  Primary pathology is right foraminal stenosis with nerve compression affecting the C5, and C6 nerve roots.  This is consistent with the patient's neurological deficits.  In addition there is significant left C7 foraminal stenosis which I believe is the source of his new onset of left C7 dysesthesias.  I have gone over the patient's pathology as well as the procedure and all of his and his wife's questions addressed.  Plan on moving forward with ACDF C4-7.

## 2018-05-19 NOTE — Anesthesia Postprocedure Evaluation (Signed)
Anesthesia Post Note  Patient: James Phelps  Procedure(s) Performed: ANTERIOR CERVICAL DECOMPRESSION/DISCECTOMY FUSION CERVICAL FOUR-CERVICAL SEVEN (N/A Spine Cervical)     Patient location during evaluation: PACU Anesthesia Type: General Level of consciousness: awake and alert, patient cooperative and oriented Pain management: pain level controlled Vital Signs Assessment: post-procedure vital signs reviewed and stable Respiratory status: spontaneous breathing, nonlabored ventilation and respiratory function stable Cardiovascular status: blood pressure returned to baseline and stable Postop Assessment: no apparent nausea or vomiting Anesthetic complications: no    Last Vitals:  Vitals:   05/19/18 1355 05/19/18 1400  BP: (!) 147/99   Pulse: 94   Resp: (!) 22 20  Temp:    SpO2: 92%     Last Pain:  Vitals:   05/19/18 1400  TempSrc:   PainSc: 5                  Roise Emert,E. Gabrielle Wakeland

## 2018-05-19 NOTE — Brief Op Note (Signed)
05/19/2018  1:09 PM  PATIENT:  James Phelps  57 y.o. male  PRE-OPERATIVE DIAGNOSIS:  Right C5-7 radicular weakness with foraminal stenosis  POST-OPERATIVE DIAGNOSIS:  Right C5-7 radicular weakness with foraminal stenosis  PROCEDURE:  Procedure(s) with comments: ANTERIOR CERVICAL DECOMPRESSION/DISCECTOMY FUSION CERVICAL FOUR-CERVICAL SEVEN (N/A) - ANTERIOR CERVICAL DECOMPRESSION/DISCECTOMY FUSION CERVICAL FOUR-CERVICAL SEVEN  SURGEON:  Surgeon(s) and Role:    Venita Lick, MD - Primary  PHYSICIAN ASSISTANT:   ASSISTANTS: none   ANESTHESIA:   general  EBL:  50 mL   BLOOD ADMINISTERED:none  DRAINS: (1) TLS Drain(s) to suction in the neck   LOCAL MEDICATIONS USED:  MARCAINE     SPECIMEN:  No Specimen  DISPOSITION OF SPECIMEN:  N/A  COUNTS:  YES  TOURNIQUET:  * No tourniquets in log *  DICTATION: .Dragon Dictation  PLAN OF CARE: Admit for overnight observation  PATIENT DISPOSITION:  PACU - hemodynamically stable.

## 2018-05-19 NOTE — Anesthesia Procedure Notes (Signed)
Procedure Name: Intubation Date/Time: 05/19/2018 7:46 AM Performed by: Raenette Rover, CRNA Pre-anesthesia Checklist: Patient identified, Emergency Drugs available, Suction available and Patient being monitored Patient Re-evaluated:Patient Re-evaluated prior to induction Oxygen Delivery Method: Circle system utilized Preoxygenation: Pre-oxygenation with 100% oxygen Induction Type: IV induction Ventilation: Mask ventilation without difficulty and Oral airway inserted - appropriate to patient size Laryngoscope Size: Mac and 4 Grade View: Grade I Tube type: Oral Tube size: 8.0 mm Number of attempts: 1 Airway Equipment and Method: Stylet Placement Confirmation: ETT inserted through vocal cords under direct vision,  positive ETCO2,  CO2 detector and breath sounds checked- equal and bilateral Secured at: 23 cm Tube secured with: Tape Dental Injury: Teeth and Oropharynx as per pre-operative assessment

## 2018-05-20 ENCOUNTER — Encounter (HOSPITAL_COMMUNITY): Payer: Self-pay | Admitting: Orthopedic Surgery

## 2018-05-20 DIAGNOSIS — M5412 Radiculopathy, cervical region: Secondary | ICD-10-CM | POA: Diagnosis not present

## 2018-05-20 NOTE — Progress Notes (Signed)
    Subjective: Procedure(s) (LRB): ANTERIOR CERVICAL DECOMPRESSION/DISCECTOMY FUSION CERVICAL FOUR-CERVICAL SEVEN (N/A) 1 Day Post-Op  Patient reports pain as 0 on 0-10 scale.  Reports none arm pain reports incisional neck pain   Positive void Negative bowel movement Positive flatus Negative chest pain or shortness of breath  Objective: Vital signs in last 24 hours: Temp:  [97.5 F (36.4 C)-98.2 F (36.8 C)] 98 F (36.7 C) (12/27 0735) Pulse Rate:  [87-122] 87 (12/27 0735) Resp:  [15-99] 99 (12/27 0735) BP: (139-169)/(68-104) 149/104 (12/27 0735) SpO2:  [92 %-99 %] 99 % (12/27 0735)  Intake/Output from previous day: 12/26 0701 - 12/27 0700 In: 3650 [P.O.:750; I.V.:2800; IV Piggyback:100] Out: 1587 [Urine:1500; Drains:37; Blood:50]  Labs: No results for input(s): WBC, RBC, HCT, PLT in the last 72 hours. No results for input(s): NA, K, CL, CO2, BUN, CREATININE, GLUCOSE, CALCIUM in the last 72 hours. No results for input(s): LABPT, INR in the last 72 hours.  Physical Exam: Intact pulses distally Incision: dressing C/D/I Compartment soft R UE:4+/5 deltoid and bicep strength (improved from pre-op) Body mass index is 27.87 kg/m.  Patient notes improved sensation toLT in the UE Negative nerve root tension signs    Assessment/Plan: Patient stable  xrays n/a Mobilization with physical therapy Encourage incentive spirometry Continue care  Advance diet Up with therapy  Doing well UE weakness improving Ok for d/c - f/u in 2 weeks  Venita Lick, MD [facility] [phone]

## 2018-05-20 NOTE — Evaluation (Signed)
Physical Therapy Evaluation Patient Details Name: James Phelps MRN: [id number] DOB: Nov 02, 1960 Today's Date: 05/20/2018   History of Present Illness  Pt is a 57 y/o male as/p C4-C7 ACDF.  PMH: HTN.   Clinical Impression  Patient evaluated by Physical Therapy with no further acute PT needs identified. Ambulating hallway distances independently and negotiated a flight of steps without difficulty. Able to recall and maintain all cervical precautions with good technique. Demonstrated car transfer and educated on generalized walking program and activity progression. All education has been completed and the patient has no further questions. No follow-up Physical Therapy or equipment needs. PT is signing off. Thank you for this referral.     Follow Up Recommendations No PT follow up    Equipment Recommendations  None recommended by PT    Recommendations for Other Services       Precautions / Restrictions Precautions Precautions: Cervical Precaution Booklet Issued: Yes (comment) Required Braces or Orthoses: Cervical Brace Cervical Brace: Hard collar;At all times Restrictions Weight Bearing Restrictions: No      Mobility  Bed Mobility               General bed mobility comments: Standing in room upon entry  Transfers Overall transfer level: Independent Equipment used: None Transfers: Sit to/from Stand Sit to Stand: Modified independent (Device/Increase time)         General transfer comment: demonstrates good technique and safety  Ambulation/Gait Ambulation/Gait assistance: Independent Gait Distance (Feet): 400 Feet Assistive device: None Gait Pattern/deviations: WFL(Within Functional Limits)   Gait velocity interpretation: >4.37 ft/sec, indicative of normal walking speed    Stairs Stairs: Yes Stairs assistance: Modified independent (Device/Increase time) Stair Management: One rail Right Number of Stairs: 20 General stair comments: Instructed pt to  negotiate step by step for safety and for maintaining cervical precautions  Wheelchair Mobility    Modified Rankin (Stroke Patients Only)       Balance Overall balance assessment: No apparent balance deficits (not formally assessed)                                           Pertinent Vitals/Pain Pain Assessment: No/denies pain Faces Pain Scale: Hurts a little bit Pain Location: neck Pain Descriptors / Indicators: Discomfort;Operative site guarding Pain Intervention(s): Limited activity within patient's tolerance;Monitored during session    Home Living Family/patient expects to be discharged to:: Private residence Living Arrangements: Alone Available Help at Discharge: Family;Available PRN/intermittently Type of Home: Apartment Home Access: Stairs to enter   Entrance Stairs-Number of Steps: flight Home Layout: One level Home Equipment: None Additional Comments: reports daughter will be home from school break    Prior Function Level of Independence: Independent               Hand Dominance        Extremity/Trunk Assessment   Upper Extremity Assessment Upper Extremity Assessment: RUE deficits/detail;LUE deficits/detail RUE Deficits / Details: Strength 5/5 except deltoid 4/5 LUE Deficits / Details: Strength 5/5    Lower Extremity Assessment Lower Extremity Assessment: RLE deficits/detail;LLE deficits/detail RLE Deficits / Details: Strength 5/5 LLE Deficits / Details: Strength 5/5    Cervical / Trunk Assessment Cervical / Trunk Assessment: Other exceptions Cervical / Trunk Exceptions: s/p ACDF  Communication   Communication: No difficulties  Cognition Arousal/Alertness: Awake/alert Behavior During Therapy: WFL for tasks assessed/performed Overall Cognitive Status: Within Functional Limits for tasks assessed  General Comments      Exercises     Assessment/Plan    PT  Assessment Patent does not need any further PT services  PT Problem List         PT Treatment Interventions      PT Goals (Current goals can be found in the Care Plan section)  Acute Rehab PT Goals Patient Stated Goal: home today PT Goal Formulation: All assessment and education complete, DC therapy    Frequency     Barriers to discharge        Co-evaluation               AM-PAC PT "6 Clicks" Mobility  Outcome Measure Help needed turning from your back to your side while in a flat bed without using bedrails?: None Help needed moving from lying on your back to sitting on the side of a flat bed without using bedrails?: None Help needed moving to and from a bed to a chair (including a wheelchair)?: None Help needed standing up from a chair using your arms (e.g., wheelchair or bedside chair)?: None Help needed to walk in hospital room?: None Help needed climbing 3-5 steps with a railing? : None 6 Click Score: 24    End of Session Equipment Utilized During Treatment: Cervical collar Activity Tolerance: Patient tolerated treatment well Patient left: in bed Nurse Communication: Mobility status PT Visit Diagnosis: Difficulty in walking, not elsewhere classified (R26.2)    Time: 1610-96040805-0820 PT Time Calculation (min) (ACUTE ONLY): 15 min   Charges:   PT Evaluation $PT Eval Low Complexity: 1 Low        Laurina Bustlearoline Evie Croston, PT, DPT Acute Rehabilitation Services Pager [phone] Office [phone]  Vanetta MuldersCarloine H Misao Fackrell 05/20/2018, 8:31 AM

## 2018-05-20 NOTE — Evaluation (Addendum)
Occupational Therapy Evaluation and Discharge Patient Details Name: James Phelps MRN: [id number] DOB: [birth year] Today's Date: 05/20/2018    History of Present Illness Pt is a 57 y/o male as/p C4-C7 ACDF.  PMH: HTN.    Clinical Impression   PTA patient independent and working. Admitted for above and limited by problem list below.  Patient educated on brace wear schedule, precautions, ADL compensatory techniques and recommendations.  Completes self care and func transfers at modified independence level after education of compensatory techniques due to precautions. Verbalizes understanding of all education.  At this time, no further OT needs have been identified and OT signing off.     Follow Up Recommendations  No OT follow up    Equipment Recommendations  None recommended by OT(plans to purchase shower stool)    Recommendations for Other Services       Precautions / Restrictions Precautions Precautions: Cervical Precaution Booklet Issued: Yes (comment) Required Braces or Orthoses: Cervical Brace Cervical Brace: Hard collar;At all times Restrictions Weight Bearing Restrictions: No      Mobility Bed Mobility               General bed mobility comments: seated EOB on entry, verbally reviewed technique   Transfers Overall transfer level: Modified independent Equipment used: None Transfers: Sit to/from Stand Sit to Stand: Modified independent (Device/Increase time)         General transfer comment: demonstrates good technique and safety    Balance Overall balance assessment: No apparent balance deficits (not formally assessed)                                         ADL either performed or assessed with clinical judgement   ADL Overall ADL's : Needs assistance/impaired     Grooming: Modified independent;Standing Grooming Details (indicate cue type and reason): after education of precautions and compensatory techniques      Lower Body  Bathing: Modified independent;Sit to/from stand Lower Body Bathing Details (indicate cue type and reason): after education of precautions and compensatory techniques, figure 4 technique for LB      Lower Body Dressing: Sit to/from stand;Modified independent Lower Body Dressing Details (indicate cue type and reason): after education of precautions and compensatory techniques, figure 4 technique for socks  Toilet Transfer: Modified Independent;Ambulation       Tub/ Shower Transfer: Modified independent;Tub transfer;Ambulation;Shower Field seismologist Details (indicate cue type and reason): reviewed technique and safety Functional mobility during ADLs: Modified independent General ADL Comments: patient educated on precautions and compensatory techniques      Vision   Vision Assessment?: No apparent visual deficits     Perception     Praxis      Pertinent Vitals/Pain Pain Assessment: Faces Faces Pain Scale: Hurts a little bit Pain Location: neck Pain Descriptors / Indicators: Discomfort;Operative site guarding Pain Intervention(s): Limited activity within patient's tolerance;Monitored during session     Hand Dominance     Extremity/Trunk Assessment Upper Extremity Assessment Upper Extremity Assessment: Overall WFL for tasks assessed(within cervical precautions, reports no numbness/tingling )   Lower Extremity Assessment Lower Extremity Assessment: Defer to PT evaluation   Cervical / Trunk Assessment Cervical / Trunk Assessment: Other exceptions Cervical / Trunk Exceptions: s/p ACDF   Communication Communication Communication: No difficulties   Cognition Arousal/Alertness: Awake/alert Behavior During Therapy: WFL for tasks assessed/performed Overall Cognitive Status: Within Functional Limits for tasks assessed  General Comments       Exercises     Shoulder Instructions      Home Living Family/patient  expects to be discharged to:: Private residence Living Arrangements: Alone Available Help at Discharge: Family;Available PRN/intermittently Type of Home: Apartment Home Access: Stairs to enter Entrance Stairs-Number of Steps: flight   Home Layout: One level     Bathroom Shower/Tub: Chief Strategy OfficerTub/shower unit   Bathroom Toilet: Handicapped height Bathroom Accessibility: Yes   Home Equipment: None   Additional Comments: reports daughter will be home from school break      Prior Functioning/Environment Level of Independence: Independent                 OT Problem List: Decreased knowledge of precautions;Decreased knowledge of use of DME or AE;Pain      OT Treatment/Interventions:      OT Goals(Current goals can be found in the care plan section) Acute Rehab OT Goals Patient Stated Goal: home today OT Goal Formulation: With patient  OT Frequency:     Barriers to D/C:            Co-evaluation              AM-PAC OT "6 Clicks" Daily Activity     Outcome Measure Help from another person eating meals?: None Help from another person taking care of personal grooming?: None Help from another person toileting, which includes using toliet, bedpan, or urinal?: None Help from another person bathing (including washing, rinsing, drying)?: None Help from another person to put on and taking off regular upper body clothing?: None Help from another person to put on and taking off regular lower body clothing?: None 6 Click Score: 24   End of Session Equipment Utilized During Treatment: Cervical collar  Activity Tolerance: Patient tolerated treatment well Patient left: with call bell/phone within reach;Other (comment)(seated EOB )  OT Visit Diagnosis: Pain Pain - part of body: (neck)                Time: [id number] OT Time Calculation (min): 11 min Charges:  OT General Charges $OT Visit: 1 Visit OT Evaluation $OT Eval Low Complexity: 1 Low  Chancy Milroyhristie S Ruthia Person, OT Acute  Rehabilitation Services Pager [phone] Office [phone]   Chancy MilroyChristie S Mccartney Chuba 05/20/2018, 8:09 AM

## 2018-05-20 NOTE — Progress Notes (Signed)
Patient is discharged from room 3c07 at this time. Alert and in stable condition. IV site d/c'd and instructions read to patient and spouse with understanding verbalized. Left unit via wheelchair with all belongings at side.

## 2018-05-30 NOTE — Discharge Summary (Signed)
Patient ID: James Phelps MRN: [id number] DOB/AGE: November 05, 1960 58 y.o.  Admit date: 05/19/2018 Discharge date: 05/30/2018  Admission Diagnoses:  Active Problems:   Cervical disc herniation   Discharge Diagnoses:  Active Problems:   Cervical disc herniation  status post Procedure(s): ANTERIOR CERVICAL DECOMPRESSION/DISCECTOMY FUSION CERVICAL FOUR-CERVICAL SEVEN  Past Medical History:  Diagnosis Date  . Hypertension     Surgeries: Procedure(s): ANTERIOR CERVICAL DECOMPRESSION/DISCECTOMY FUSION CERVICAL FOUR-CERVICAL SEVEN on 05/19/2018   Consultants:   Discharged Condition: Improved  Hospital Course: James Phelps is an 58 y.o. male who was admitted 05/19/2018 for operative treatment of cervical disc herniation with motor deficits. Patient failed conservative treatments (please see the history and physical for the specifics) and had severe unremitting pain that affects sleep, daily activities and work/hobbies. After pre-op clearance, the patient was taken to the operating room on 05/19/2018 and underwent  Procedure(s): ANTERIOR CERVICAL DECOMPRESSION/DISCECTOMY FUSION CERVICAL FOUR-CERVICAL SEVEN.    Patient was given perioperative antibiotics:  Anti-infectives (From admission, onward)   Start     Dose/Rate Route Frequency Ordered Stop   05/19/18 2000  ceFAZolin (ANCEF) IVPB 1 g/50 mL premix     1 g 100 mL/hr over 30 Minutes Intravenous Every 8 hours 05/19/18 1444 05/20/18 0516   05/19/18 0546  ceFAZolin (ANCEF) IVPB 2g/100 mL premix     2 g 200 mL/hr over 30 Minutes Intravenous 30 min pre-op 05/19/18 0546 05/19/18 1133       Patient was given sequential compression devices and early ambulation to prevent DVT.   Patient benefited maximally from hospital stay and there were no complications. At the time of discharge, the patient was urinating/moving their bowels without difficulty, tolerating a regular diet, pain is controlled with oral pain medications and they have  been cleared by PT/OT.   Hospital course was uneventful.  Radicular weakness showed improved on POD#1.    Recent vital signs: No data found.   Recent laboratory studies: No results for input(s): WBC, HGB, HCT, PLT, NA, K, CL, CO2, BUN, CREATININE, GLUCOSE, INR, CALCIUM in the last 72 hours.  Invalid input(s): PT, 2   Discharge Medications:   Allergies as of 05/20/2018   No Known Allergies     Medication List    STOP taking these medications   multivitamin with minerals Tabs tablet     TAKE these medications   ACETYL L-CARNITINE PO Take 1,000 mg by mouth daily.   olmesartan-hydrochlorothiazide 20-12.5 MG tablet Commonly known as:  BENICAR HCT Take 1 tablet by mouth daily.   ondansetron 4 MG tablet Commonly known as:  ZOFRAN Take 1 tablet (4 mg total) by mouth every 8 (eight) hours as needed for nausea or vomiting.     ASK your doctor about these medications   methocarbamol 500 MG tablet Commonly known as:  ROBAXIN Take 1 tablet (500 mg total) by mouth every 8 (eight) hours as needed for up to 5 days for muscle spasms. Ask about: Should I take this medication?   oxyCODONE-acetaminophen 10-325 MG tablet Commonly known as:  PERCOCET Take 1 tablet by mouth every 6 (six) hours as needed for up to 5 days for pain. Ask about: Should I take this medication?       Diagnostic Studies: Dg Cervical Spine 2-3 Views  Result Date: 05/19/2018 CLINICAL DATA:  C4-C7 ACDF. EXAM: DG C-ARM 61-120 MIN; CERVICAL SPINE - 2-3 VIEW COMPARISON:  None. FLUOROSCOPY TIME:  1 minutes, 8 seconds. C-arm fluoroscopic images were obtained intraoperatively and submitted for post operative  interpretation. FINDINGS: AP and lateral intraoperative fluoroscopic images demonstrate C4-C7 ACDF. Alignment is normal. Vertebral body heights are preserved. IMPRESSION: Intraoperative fluoroscopic guidance for C4-C7 ACDF. Electronically Signed   By: Obie DredgeWilliam T Derry M.D.   On: 05/19/2018 13:37   Dg C-arm 1-60  Min  Result Date: 05/19/2018 CLINICAL DATA:  C4-C7 ACDF. EXAM: DG C-ARM 61-120 MIN; CERVICAL SPINE - 2-3 VIEW COMPARISON:  None. FLUOROSCOPY TIME:  1 minutes, 8 seconds. C-arm fluoroscopic images were obtained intraoperatively and submitted for post operative interpretation. FINDINGS: AP and lateral intraoperative fluoroscopic images demonstrate C4-C7 ACDF. Alignment is normal. Vertebral body heights are preserved. IMPRESSION: Intraoperative fluoroscopic guidance for C4-C7 ACDF. Electronically Signed   By: Obie DredgeWilliam T Derry M.D.   On: 05/19/2018 13:37    Discharge Instructions    Incentive spirometry RT   Complete by:  As directed       Follow-up Information    Venita LickBrooks, Graysin Luczynski, MD In 2 weeks.   Specialty:  Orthopedic Surgery Why:  If symptoms worsen, For suture removal, For wound re-check Contact information: [address] [place] [address] [phone]           Discharge Plan:  discharge to home Disposition: stable    Signed: Venita LickBROOKS,Delainey Winstanley D for Dr. Venita Lickahari Willies Laviolette [facility] [phone] 05/30/2018, 8:40 AM

## 2018-06-08 ENCOUNTER — Ambulatory Visit: Payer: 59 | Admitting: Cardiology

## 2018-08-04 ENCOUNTER — Ambulatory Visit: Payer: Self-pay | Admitting: Cardiology

## 2018-08-12 ENCOUNTER — Telehealth: Payer: Self-pay | Admitting: Cardiology

## 2018-08-12 NOTE — Telephone Encounter (Signed)
   Primary Cardiologist:  Dr Antoine Poche  Patient contacted.  History reviewed.  No symptoms to suggest any unstable cardiac conditions.  Based on discussion, with current pandemic situation, we will be postponing this appointment for [facility].  If symptoms change, he has been instructed to contact our office.   Routing to C19 CANCEL pool for tracking (P CV DIV CV19 CANCEL) and assigning priority (1 = 4-6 wks, 2 = 6-12 wks, 3 = >12 wks).  Corine Shelter, [place]  08/12/2018 2:38 PM         .

## 2018-08-17 ENCOUNTER — Ambulatory Visit: Payer: Self-pay | Admitting: Cardiology

## 2018-09-02 ENCOUNTER — Telehealth: Payer: Self-pay

## 2018-09-02 NOTE — Telephone Encounter (Signed)
Virtual Visit Pre-Appointment Phone Call  Steps For Call: VERBAL CONSENT BY PHONE   1. Confirm consent - "In the setting of the current Covid19 crisis, you are scheduled for a (phone or video) visit with your provider on (date) at (time).  Just as we do with many in-office visits, in order for you to participate in this visit, we must obtain consent.  If you'd like, I can send this to your mychart (if signed up) or email for you to review.  Otherwise, I can obtain your verbal consent now.  All virtual visits are billed to your insurance company just like a normal visit would be.  By agreeing to a virtual visit, we'd like you to understand that the technology does not allow for your provider to perform an examination, and thus may limit your provider's ability to fully assess your condition.  Finally, though the technology is pretty good, we cannot assure that it will always work on either your or our end, and in the setting of a video visit, we may have to convert it to a phone-only visit.  In either situation, we cannot ensure that we have a secure connection.  Are you willing to proceed?"  2. Give patient instructions for WebEx download to smartphone as below if video visit  3. Advise patient to be prepared with any vital sign or heart rhythm information, their current medicines, and a piece of paper and pen handy for any instructions they may receive the day of their visit  4. Inform patient they will receive a phone call 15 minutes prior to their appointment time (may be from unknown caller ID) so they should be prepared to answer  5. Confirm that appointment type is correct in Epic appointment notes (video vs telephone)    TELEPHONE CALL NOTE  James Phelps has been deemed a candidate for a follow-up tele-health visit to limit community exposure during the Covid-19 pandemic. I spoke with the patient via phone to ensure availability of phone/video source, confirm preferred email &  phone number, and discuss instructions and expectations.  I reminded James Phelps to be prepared with any vital sign and/or heart rhythm information that could potentially be obtained via home monitoring, at the time of his visit. I reminded James Phelps to expect a phone call at the time of his visit if his visit.  Did the patient verbally acknowledge consent to treatment?   Chana Bode, CMA 09/02/2018 1:43 PM   DOWNLOADING THE WEBEX SOFTWARE TO SMARTPHONE  - If Apple, go to [employer] and type in WebEx in the search bar. Download [employer], the blue/Cory Kitt circle. The app is free but as with any other app downloads, their phone may require them to verify saved payment information or Apple password. The patient does NOT have to create an account.  - If Android, ask patient to go to [employer] and type in WebEx in the search bar. Download [employer], the blue/Yeila Morro circle. The app is free but as with any other app downloads, their phone may require them to verify saved payment information or Android password. The patient does NOT have to create an account.   CONSENT FOR TELE-HEALTH VISIT - PLEASE REVIEW  I hereby voluntarily request, consent and authorize [facility] and its employed or contracted physicians, physician assistants, nurse practitioners or other licensed health care professionals (the Practitioner), to provide me with telemedicine health care services (the "Services") as deemed  necessary by the treating Practitioner. I acknowledge and consent to receive the Services by the Practitioner via telemedicine. I understand that the telemedicine visit will involve communicating with the Practitioner through live audiovisual communication technology and the disclosure of certain medical information by electronic transmission. I acknowledge that I have been given the opportunity to request an in-person assessment or other available alternative prior  to the telemedicine visit and am voluntarily participating in the telemedicine visit.  I understand that I have the right to withhold or withdraw my consent to the use of telemedicine in the course of my care at any time, without affecting my right to future care or treatment, and that the Practitioner or I may terminate the telemedicine visit at any time. I understand that I have the right to inspect all information obtained and/or recorded in the course of the telemedicine visit and may receive copies of available information for a reasonable fee.  I understand that some of the potential risks of receiving the Services via telemedicine include:  Marland Kitchen Delay or interruption in medical evaluation due to technological equipment failure or disruption; . Information transmitted may not be sufficient (e.g. poor resolution of images) to allow for appropriate medical decision making by the Practitioner; and/or  . In rare instances, security protocols could fail, causing a breach of personal health information.  Furthermore, I acknowledge that it is my responsibility to provide information about my medical history, conditions and care that is complete and accurate to the best of my ability. I acknowledge that Practitioner's advice, recommendations, and/or decision may be based on factors not within their control, such as incomplete or inaccurate data provided by me or distortions of diagnostic images or specimens that may result from electronic transmissions. I understand that the practice of medicine is not an exact science and that Practitioner makes no warranties or guarantees regarding treatment outcomes. I acknowledge that I will receive a copy of this consent concurrently upon execution via email to the email address I last provided but may also request a printed copy by calling the office of [facility].    I understand that my insurance will be billed for this visit.   I have read or had this consent read  to me. . I understand the contents of this consent, which adequately explains the benefits and risks of the Services being provided via telemedicine.  . I have been provided ample opportunity to ask questions regarding this consent and the Services and have had my questions answered to my satisfaction. . I give my informed consent for the services to be provided through the use of telemedicine in my medical care  By participating in this telemedicine visit I agree to the above.

## 2018-09-20 ENCOUNTER — Telehealth: Payer: Self-pay | Admitting: Cardiology

## 2018-10-04 ENCOUNTER — Telehealth: Payer: Self-pay | Admitting: Cardiology

## 2018-10-04 NOTE — Telephone Encounter (Signed)
Follow up:    Patient calling with insurance information, but patient would like cancel appt. To June.

## 2018-10-04 NOTE — Telephone Encounter (Signed)
Left message to call back  

## 2018-10-05 NOTE — Telephone Encounter (Signed)
Lm to call back ./cy 

## 2018-10-06 NOTE — Telephone Encounter (Signed)
Per pt no complaints and would rather come in to see MD Appt rescheduled to 01/17/19 at 4:00 pm Pt will call if has issues prior to appt ./cy

## 2018-10-14 ENCOUNTER — Telehealth: Payer: BLUE CROSS/BLUE SHIELD | Admitting: Cardiology

## 2019-01-16 NOTE — Progress Notes (Deleted)
Cardiology Office Note   Date:  01/16/2019   ID:  Abu, Slingluff 1960-10-31, MRN [id number]  PCP:  Philip Aspen, Limmie Patricia, MD  Cardiologist:   Rollene Rotunda, MD Referring:  Philip Aspen, Limmie Patricia, MD  No chief complaint on file.     History of Present Illness: James Phelps is a 58 y.o. male who presents for follow-up of an abnormal EKG.  He was being seen preoperative prior to cervical disc surgery.  He was noted to have an EKG with inferior T wave inversion.  He had a stress perfusion study that demonstrated an EF of 45%.  There was no reversible ischemia.  His echo was a mildly reduced ejection fraction of 50 to 55%.  He was found to have moderate LVH.  He returns for follow up.  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  Most of that has been weight lifting.  He did use steroids for competition but has not used these in years.  He has had a bit of a hectic life recently with a divorce that just went final.  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  He has been smoking a few cigarettes and drinking too much alcohol.  He eats relatively healthy however.  He is never really had his blood pressure checked but was noted recently during this evaluation to have hypertension.  Because of the abnormalities above he was started on low-dose metoprolol.  He denies with his active exercising lifestyle any cardiovascular symptoms. The patient denies any new symptoms such as chest discomfort, neck or arm discomfort. There has been no new shortness of breath, PND or orthopnea. There have been no reported palpitations, presyncope or syncope.  He has had some arm weakness related to his cervical disc problem.   Past Medical History:  Diagnosis Date  . Hypertension     Past Surgical History:  Procedure Laterality Date  . ANTERIOR CERVICAL DECOMP/DISCECTOMY FUSION N/A 05/19/2018   Procedure: ANTERIOR CERVICAL DECOMPRESSION/DISCECTOMY FUSION CERVICAL FOUR-CERVICAL  SEVEN;  Surgeon: Venita Lick, MD;  Location: [facility] OR;  Service: Orthopedics;  Laterality: N/A;  ANTERIOR CERVICAL DECOMPRESSION/DISCECTOMY FUSION CERVICAL FOUR-CERVICAL SEVEN  . COLONOSCOPY     with sedation-no problems  . STERNUM SEPARATION REPAIR     In his youth     Current Outpatient Medications  Medication Sig Dispense Refill  . Acetylcarnitine HCl (ACETYL L-CARNITINE PO) Take 1,000 mg by mouth daily.    Marland Kitchen olmesartan-hydrochlorothiazide (BENICAR HCT) 20-12.5 MG tablet Take 1 tablet by mouth daily. 90 tablet 3  . ondansetron (ZOFRAN) 4 MG tablet Take 1 tablet (4 mg total) by mouth every 8 (eight) hours as needed for nausea or vomiting. 20 tablet 0   No current facility-administered medications for this visit.     Allergies:   Patient has no known allergies.    ROS:  Please see the history of present illness.   Otherwise, review of systems are positive for ***.   All other systems are reviewed and negative.    PHYSICAL EXAM: VS:  There were no vitals taken for this visit. , BMI There is no height or weight on file to calculate BMI.  GENERAL:  Well appearing NECK:  No jugular venous distention, waveform within normal limits, carotid upstroke brisk and symmetric, no bruits, no thyromegaly LUNGS:  Clear to auscultation bilaterally CHEST:  Unremarkable HEART:  PMI not displaced or sustained,S1 and S2 within normal limits, no  S3, no S4, no clicks, no rubs, *** murmurs ABD:  Flat, positive bowel sounds normal in frequency in pitch, no bruits, no rebound, no guarding, no midline pulsatile mass, no hepatomegaly, no splenomegaly EXT:  2 plus pulses throughout, no edema, no cyanosis no clubbing      ***GENERAL:  Well appearing HEENT:  Pupils equal round and reactive, fundi not visualized, oral mucosa unremarkable NECK:  No jugular venous distention, waveform within normal limits, carotid upstroke brisk and symmetric, no bruits, no thyromegaly LYMPHATICS:  No cervical, inguinal  adenopathy LUNGS:  Clear to auscultation bilaterally BACK:  No CVA tenderness CHEST: Well-healed scar HEART:  PMI not displaced or sustained,S1 and S2 within normal limits, no S3, no S4, no clicks, no rubs, no murmurs ABD:  Flat, positive bowel sounds normal in frequency in pitch, no bruits, no rebound, no guarding, no midline pulsatile mass, no hepatomegaly, no splenomegaly EXT:  2 plus pulses throughout, no edema, no cyanosis no clubbing SKIN:  No rashes no nodules NEURO:  Cranial nerves II through XII grossly intact, motor grossly intact throughout PSYCH:  Cognitively intact, oriented to person place and time    EKG:  EKG is not *** ordered today. The ekg ordered 04/29/18 demonstrates sinus rhythm, rate ***, axis within normal limits, intervals within normal limits, inferior T wave inversions.   Recent Labs: 04/29/2018: ALT 38; TSH 2.21 05/10/2018: BUN 12; Creatinine, Ser 1.44; Hemoglobin 19.5; Platelets 229; Potassium 4.3; Sodium 140    Lipid Panel    Component Value Date/Time   CHOL 212 (H) 04/29/2018 1053   TRIG 129.0 04/29/2018 1053   HDL 46.90 04/29/2018 1053   CHOLHDL 5 04/29/2018 1053   VLDL 25.8 04/29/2018 1053   LDLCALC 139 (H) 04/29/2018 1053      Wt Readings from Last 3 Encounters:  05/19/18 229 lb (103.9 kg)  05/10/18 229 lb (103.9 kg)  05/05/18 234 lb 6.4 oz (106.3 kg)      Other studies Reviewed:  Additional studies/ records that were reviewed today include: echo,  *** Review of the above records demonstrates: ***   ASSESSMENT AND PLAN:  ABNORMAL EKG:   The patient's EKG is *** most likely related to hypertensive heart disease.  This will be managed as below.  CARDIOMYOPATHY: He has a mildly reduced ejection fraction but no symptoms.  ***I am can manage this in the context of treating his blood pressure.  HTN:   *** I am going to start combination therapy with Benicar HCT 20/12.5.  He will likely need up titration.  We talked about therapeutic  lifestyle changes to include salt restriction and exercise.  DYSLIPIDEMIA: His LDL was  ***139 but he has not been particularly watching his diet as much as he could and so he is going to work on this and then have repeated.  HYPERTENSIVE HEART DISEASE:  *** As above.   Current medicines are reviewed at length with the patient today.  The patient does not have concerns regarding medicines.  The following changes have been made:  ***  Labs/ tests ordered today include:  *** No orders of the defined types were placed in this encounter.    Disposition:   FU with me in *** months.    Signed, Minus Breeding, MD  01/16/2019 5:39 PM    [facility]

## 2019-01-17 ENCOUNTER — Ambulatory Visit: Payer: BC Managed Care – PPO | Admitting: Cardiology

## 2019-05-11 ENCOUNTER — Other Ambulatory Visit: Payer: Self-pay | Admitting: Cardiology

## 2019-05-20 ENCOUNTER — Other Ambulatory Visit (HOSPITAL_COMMUNITY): Payer: Self-pay | Admitting: Orthopedic Surgery

## 2020-08-06 IMAGING — RF DG CERVICAL SPINE 2 OR 3 VIEWS
1 series · 2 of 2 positions shown · non-contrast
Comparison: None.

CLINICAL DATA: C4-C7 ACDF.

EXAM:
DG C-ARM 61-120 MIN; CERVICAL SPINE - 2-3 VIEW

[Series 1: run · 2 of 2 slices shown]
[im 1/2]
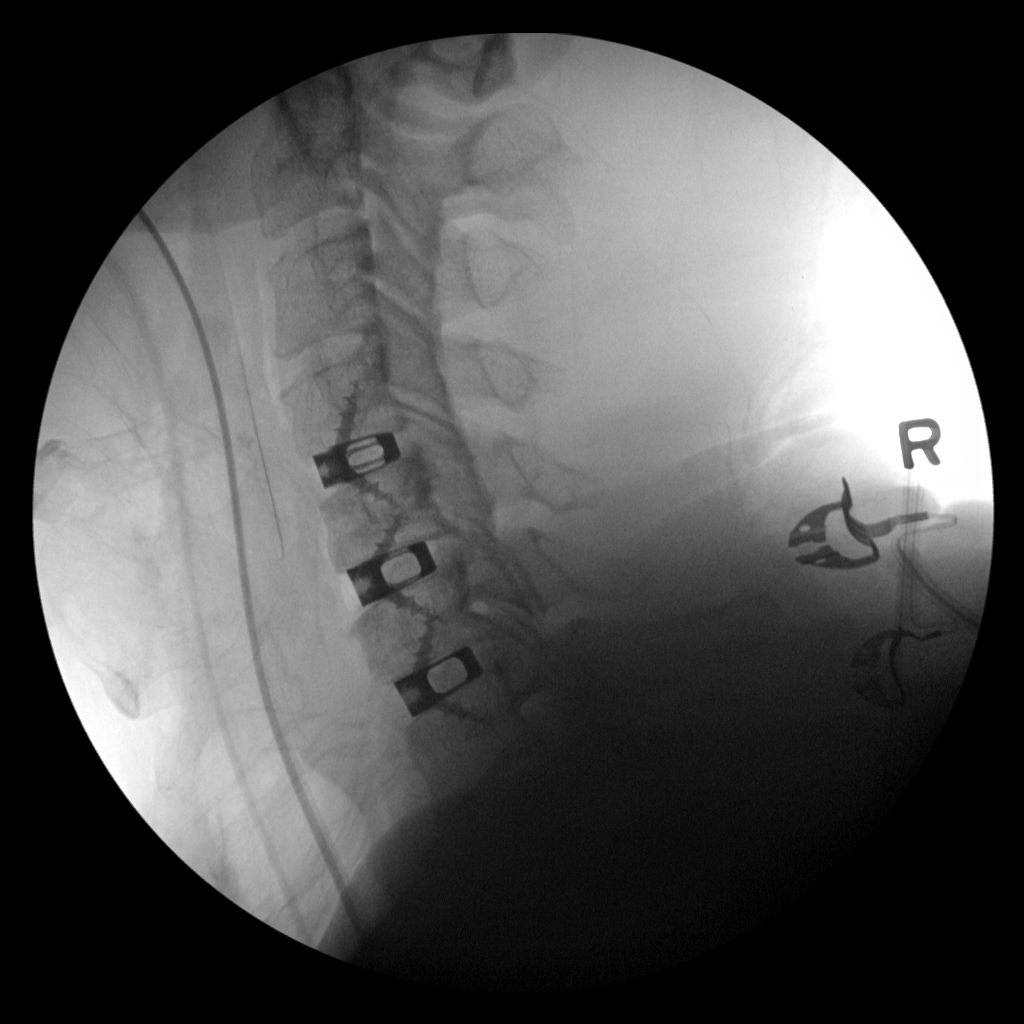
[im 2/2]
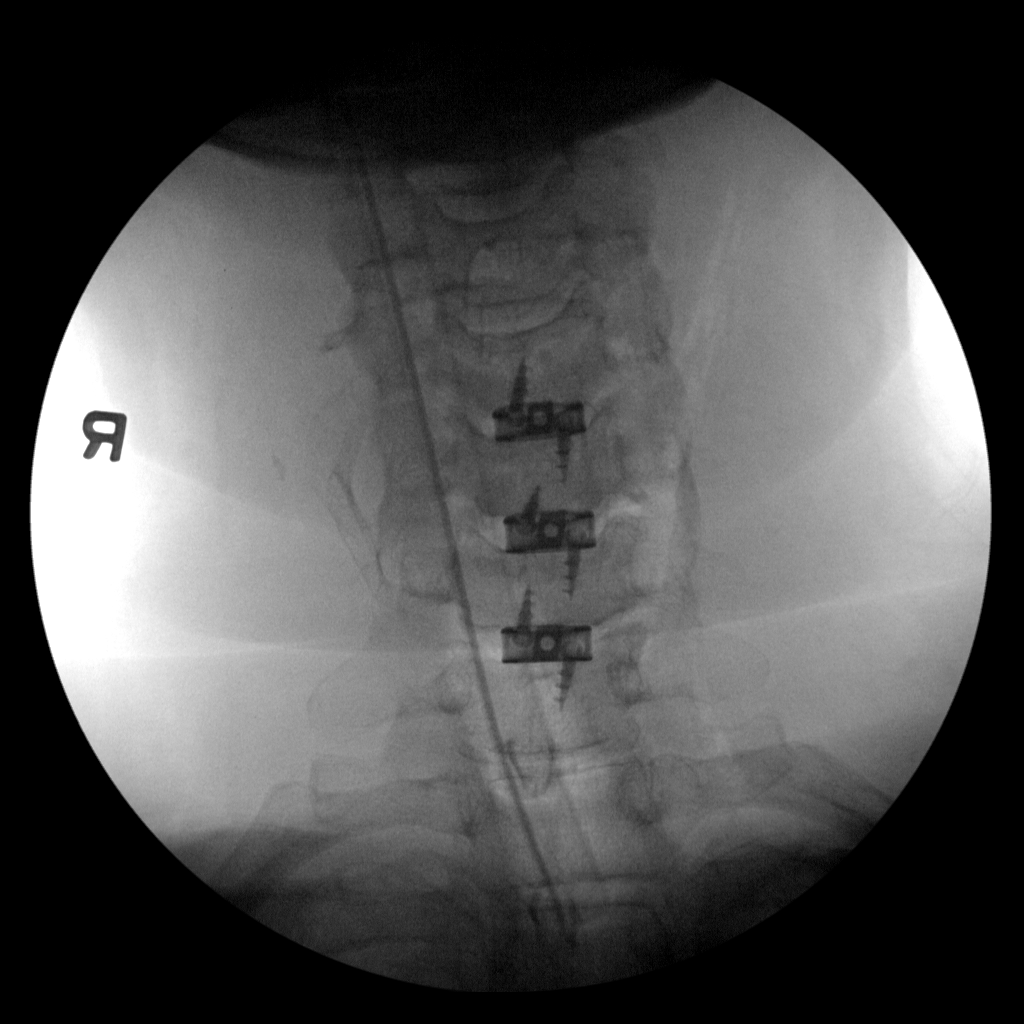

[2 of 2 positions shown; findings below may reference images not displayed]

FLUOROSCOPY TIME:  1 minutes, 8 seconds.

C-arm fluoroscopic images were obtained intraoperatively and
submitted for post operative interpretation.
FINDINGS: AP and lateral intraoperative fluoroscopic images demonstrate C4-C7
ACDF. Alignment is normal. Vertebral body heights are preserved.
IMPRESSION: Intraoperative fluoroscopic guidance for C4-C7 ACDF.
